# Patient Record
Sex: Female | Born: 1955 | Race: White | Hispanic: No | Marital: Married | Smoking: Never smoker
Health system: Southern US, Community
[De-identification: ages and names within clinical notes are randomized; demographics above are authoritative.]

## PROBLEM LIST (undated history)

## (undated) DIAGNOSIS — M1990 Unspecified osteoarthritis, unspecified site: Secondary | ICD-10-CM

## (undated) DIAGNOSIS — C801 Malignant (primary) neoplasm, unspecified: Secondary | ICD-10-CM

## (undated) HISTORY — PX: ABDOMINAL HYSTERECTOMY: SHX81

---

## 2004-04-20 ENCOUNTER — Ambulatory Visit: Payer: Self-pay | Admitting: Unknown Physician Specialty

## 2005-05-15 ENCOUNTER — Ambulatory Visit: Payer: Self-pay | Admitting: Unknown Physician Specialty

## 2005-06-21 ENCOUNTER — Ambulatory Visit: Payer: Self-pay | Admitting: Unknown Physician Specialty

## 2005-07-04 ENCOUNTER — Other Ambulatory Visit: Payer: Self-pay

## 2005-07-04 ENCOUNTER — Emergency Department: Payer: Self-pay | Admitting: Emergency Medicine

## 2006-05-30 ENCOUNTER — Ambulatory Visit: Payer: Self-pay | Admitting: Unknown Physician Specialty

## 2007-06-02 ENCOUNTER — Ambulatory Visit: Payer: Self-pay | Admitting: Unknown Physician Specialty

## 2007-12-04 DIAGNOSIS — Z86018 Personal history of other benign neoplasm: Secondary | ICD-10-CM

## 2007-12-04 HISTORY — DX: Personal history of other benign neoplasm: Z86.018

## 2013-01-21 ENCOUNTER — Ambulatory Visit: Payer: Self-pay

## 2014-01-01 DIAGNOSIS — C801 Malignant (primary) neoplasm, unspecified: Secondary | ICD-10-CM

## 2014-01-01 HISTORY — DX: Malignant (primary) neoplasm, unspecified: C80.1

## 2014-03-03 ENCOUNTER — Ambulatory Visit: Payer: Self-pay | Admitting: Family Medicine

## 2014-04-26 ENCOUNTER — Ambulatory Visit
Admit: 2014-04-26 | Disposition: A | Discharge status: Home / Self Care | Payer: Self-pay | Admitting: Unknown Physician Specialty

## 2014-04-29 LAB — SURGICAL PATHOLOGY

## 2014-09-20 DIAGNOSIS — C4492 Squamous cell carcinoma of skin, unspecified: Secondary | ICD-10-CM

## 2014-09-20 DIAGNOSIS — Z8589 Personal history of malignant neoplasm of other organs and systems: Secondary | ICD-10-CM

## 2014-09-20 HISTORY — DX: Personal history of malignant neoplasm of other organs and systems: Z85.89

## 2014-09-20 HISTORY — DX: Squamous cell carcinoma of skin, unspecified: C44.92

## 2014-10-25 DIAGNOSIS — Z86006 Personal history of melanoma in-situ: Secondary | ICD-10-CM

## 2014-10-25 HISTORY — DX: Personal history of melanoma in-situ: Z86.006

## 2017-01-31 ENCOUNTER — Other Ambulatory Visit: Payer: Self-pay | Admitting: Family Medicine

## 2017-01-31 DIAGNOSIS — Z1231 Encounter for screening mammogram for malignant neoplasm of breast: Secondary | ICD-10-CM

## 2017-02-20 ENCOUNTER — Ambulatory Visit
Admission: RE | Admit: 2017-02-20 | Discharge: 2017-02-20 | Disposition: A | Discharge status: Home / Self Care | Payer: BLUE CROSS/BLUE SHIELD | Source: Ambulatory Visit | Attending: Family Medicine | Admitting: Family Medicine

## 2017-02-20 ENCOUNTER — Other Ambulatory Visit: Payer: Self-pay | Admitting: Family Medicine

## 2017-02-20 DIAGNOSIS — Z1231 Encounter for screening mammogram for malignant neoplasm of breast: Secondary | ICD-10-CM

## 2017-02-20 HISTORY — DX: Malignant (primary) neoplasm, unspecified: C80.1

## 2017-02-25 ENCOUNTER — Other Ambulatory Visit: Payer: Self-pay | Admitting: Family Medicine

## 2017-02-25 DIAGNOSIS — R928 Other abnormal and inconclusive findings on diagnostic imaging of breast: Secondary | ICD-10-CM

## 2017-02-25 DIAGNOSIS — N6489 Other specified disorders of breast: Secondary | ICD-10-CM

## 2017-03-07 ENCOUNTER — Ambulatory Visit
Admission: RE | Admit: 2017-03-07 | Discharge: 2017-03-07 | Disposition: A | Discharge status: Home / Self Care | Payer: BLUE CROSS/BLUE SHIELD | Source: Ambulatory Visit | Attending: Family Medicine | Admitting: Family Medicine

## 2017-03-07 DIAGNOSIS — R928 Other abnormal and inconclusive findings on diagnostic imaging of breast: Secondary | ICD-10-CM

## 2017-03-07 DIAGNOSIS — N6489 Other specified disorders of breast: Secondary | ICD-10-CM | POA: Diagnosis present

## 2017-03-11 DIAGNOSIS — Z85820 Personal history of malignant melanoma of skin: Secondary | ICD-10-CM

## 2017-03-11 HISTORY — DX: Personal history of malignant melanoma of skin: Z85.820

## 2017-03-18 DIAGNOSIS — C439 Malignant melanoma of skin, unspecified: Secondary | ICD-10-CM

## 2017-03-18 HISTORY — DX: Malignant melanoma of skin, unspecified: C43.9

## 2018-01-29 ENCOUNTER — Other Ambulatory Visit: Payer: Self-pay | Admitting: Family Medicine

## 2018-01-29 DIAGNOSIS — Z1231 Encounter for screening mammogram for malignant neoplasm of breast: Secondary | ICD-10-CM

## 2018-02-28 ENCOUNTER — Ambulatory Visit
Admission: RE | Admit: 2018-02-28 | Discharge: 2018-02-28 | Disposition: A | Discharge status: Home / Self Care | Payer: PRIVATE HEALTH INSURANCE | Source: Ambulatory Visit | Attending: Family Medicine | Admitting: Family Medicine

## 2018-02-28 DIAGNOSIS — Z1231 Encounter for screening mammogram for malignant neoplasm of breast: Secondary | ICD-10-CM | POA: Insufficient documentation

## 2018-11-27 IMAGING — MG MM DIGITAL DIAGNOSTIC UNILAT*L* W/ TOMO W/ CAD
6 series · 6 of 14 positions shown · non-contrast
Comparison: 02/20/2017 and earlier priors

CLINICAL DATA: 61-year-old patient recalled from recent screening
mammogram for evaluation of a possible asymmetry in the outer left
breast.

EXAM:
DIGITAL DIAGNOSTIC UNILATERAL LEFT MAMMOGRAM WITH CAD AND TOMO

[L CC]
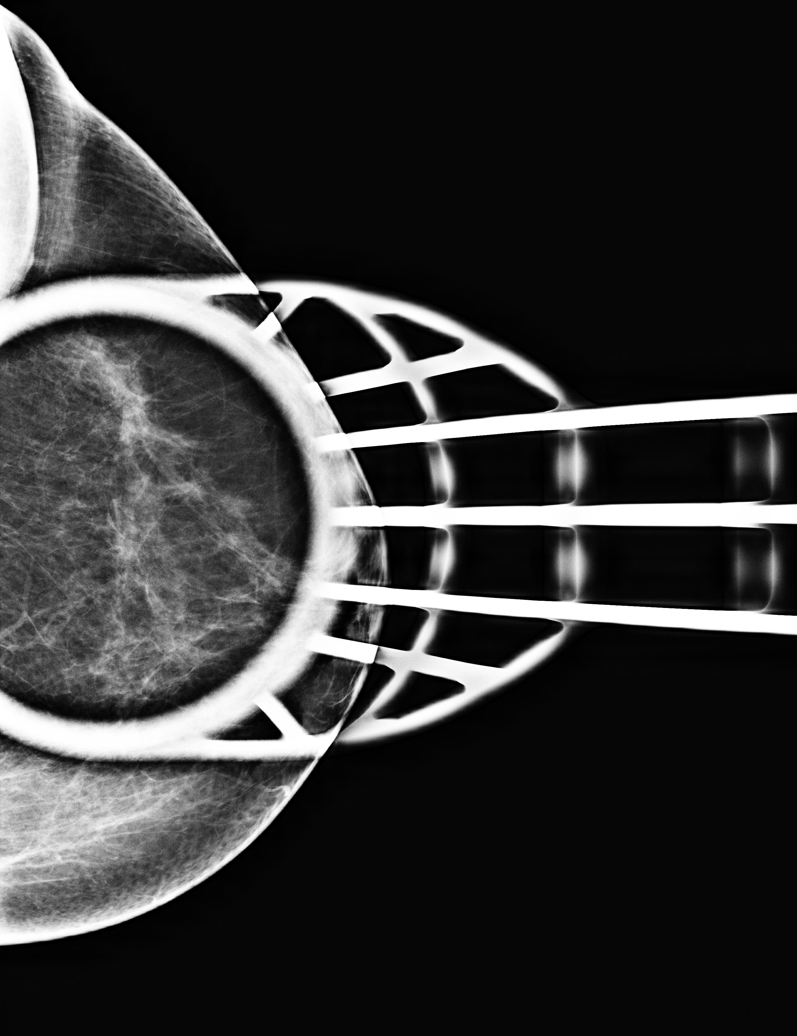

[L MLO]
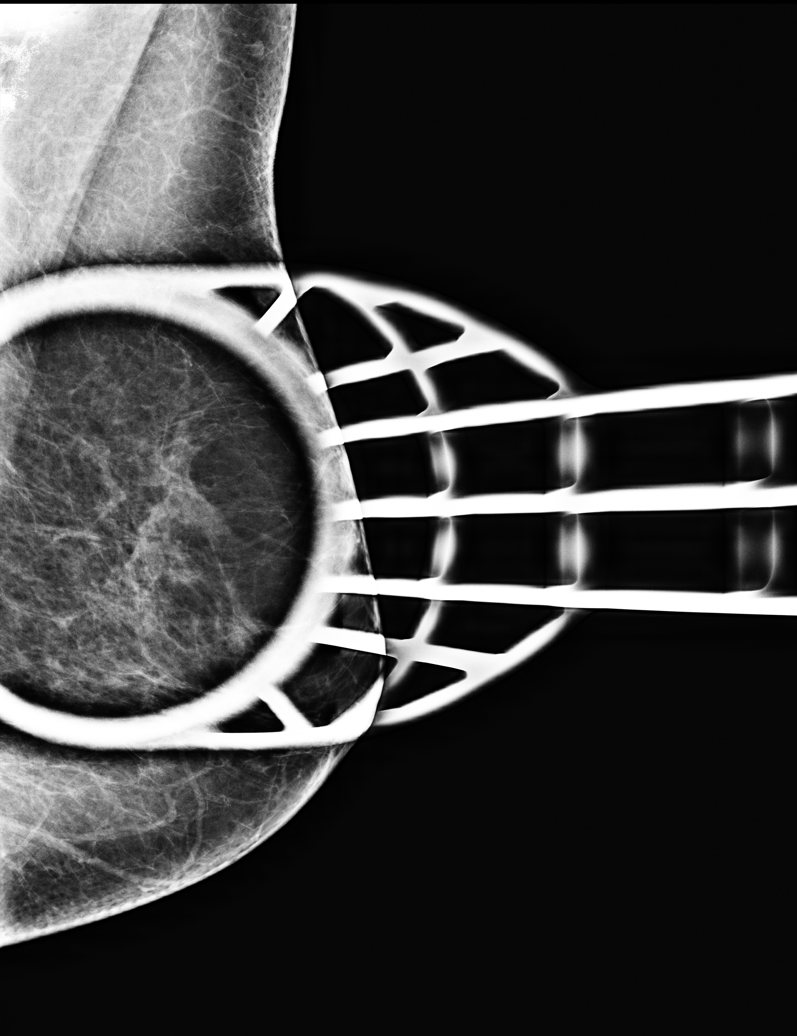

[L CC synth-2D]
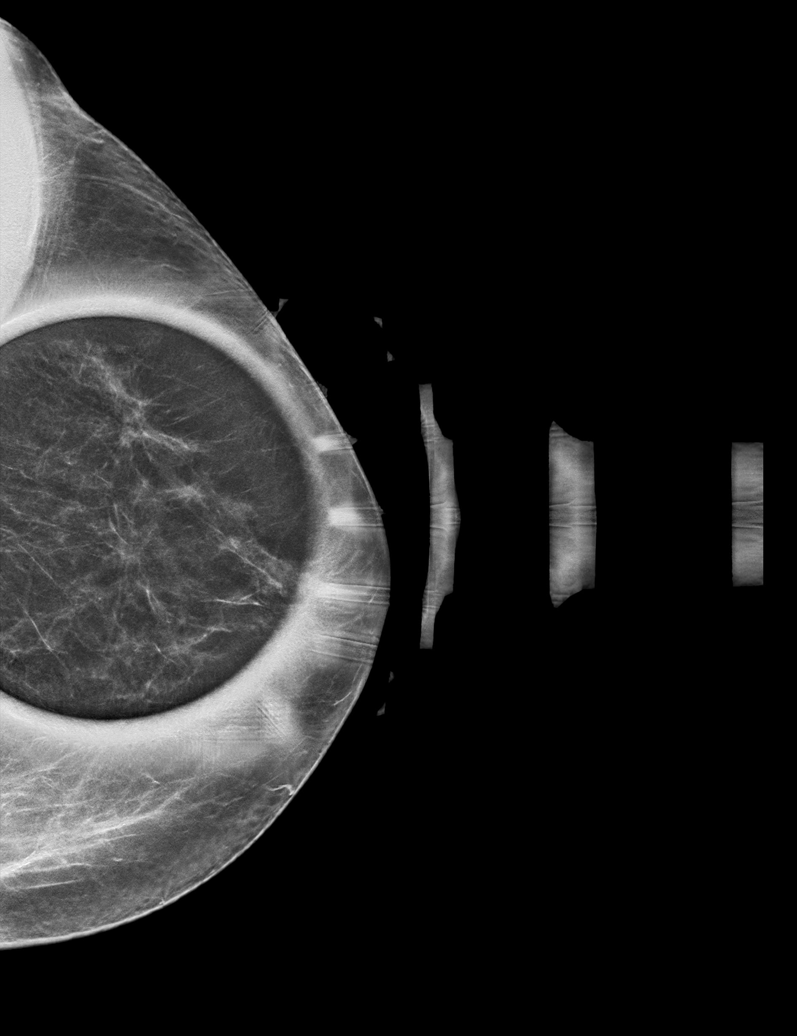

[L MLO synth-2D]
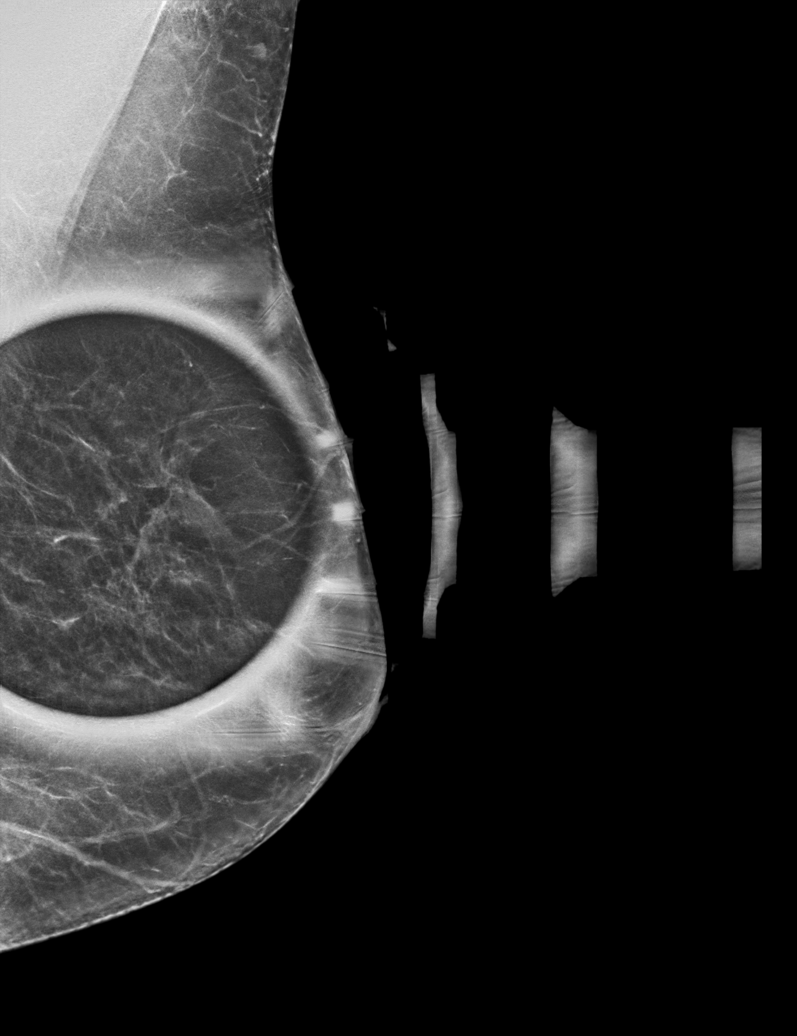

[L CC tomo · tomo slice 28/55.0]
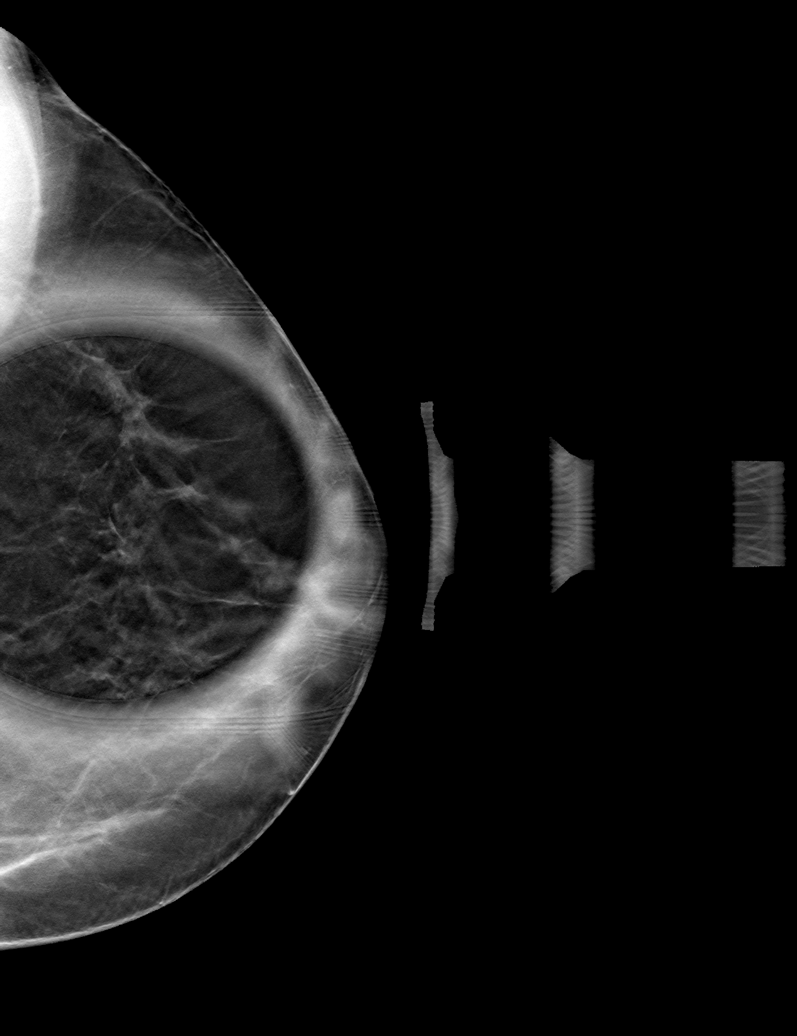

[L MLO tomo · tomo slice 27/54.0]
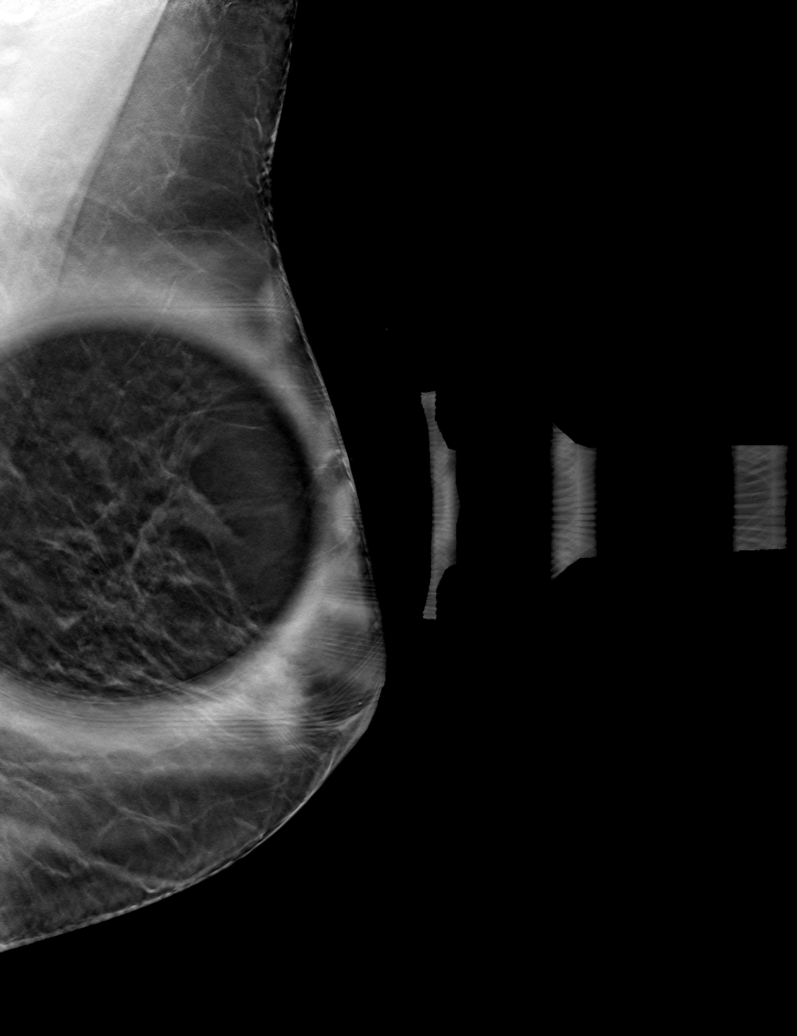

[6 of 14 positions shown; findings below may reference images not displayed]

ACR Breast Density Category c: The breast tissue is heterogeneously
dense, which may obscure small masses.
FINDINGS: Focal spot compression views of the upper outer left breast show
normal symmetric breast parenchyma. There is no mass or
architectural distortion. No suspicious findings.

Mammographic images were processed with CAD.
IMPRESSION: No evidence of malignancy in the left breast.

RECOMMENDATION:
Screening mammogram in one year.(Code:IR-X-HXH)

I have discussed the findings and recommendations with the patient.
Results were also provided in writing at the conclusion of the
visit. If applicable, a reminder letter will be sent to the patient
regarding the next appointment.

BI-RADS CATEGORY  1: Negative.

## 2019-01-26 ENCOUNTER — Other Ambulatory Visit: Payer: Self-pay | Admitting: Family Medicine

## 2019-01-26 DIAGNOSIS — Z1231 Encounter for screening mammogram for malignant neoplasm of breast: Secondary | ICD-10-CM

## 2019-03-02 ENCOUNTER — Ambulatory Visit
Admission: RE | Admit: 2019-03-02 | Discharge: 2019-03-02 | Disposition: A | Discharge status: Home / Self Care | Payer: 59 | Source: Ambulatory Visit | Attending: Family Medicine | Admitting: Family Medicine

## 2019-03-02 DIAGNOSIS — Z1231 Encounter for screening mammogram for malignant neoplasm of breast: Secondary | ICD-10-CM | POA: Diagnosis not present

## 2020-01-13 ENCOUNTER — Other Ambulatory Visit: Payer: Self-pay | Admitting: Family Medicine

## 2020-01-13 DIAGNOSIS — Z1231 Encounter for screening mammogram for malignant neoplasm of breast: Secondary | ICD-10-CM

## 2020-03-02 ENCOUNTER — Other Ambulatory Visit: Payer: Self-pay

## 2020-03-02 ENCOUNTER — Ambulatory Visit
Admission: RE | Admit: 2020-03-02 | Discharge: 2020-03-02 | Disposition: A | Discharge status: Home / Self Care | Payer: 59 | Source: Ambulatory Visit | Attending: Family Medicine | Admitting: Family Medicine

## 2020-03-02 DIAGNOSIS — Z1231 Encounter for screening mammogram for malignant neoplasm of breast: Secondary | ICD-10-CM | POA: Diagnosis present

## 2020-05-03 ENCOUNTER — Ambulatory Visit (INDEPENDENT_AMBULATORY_CARE_PROVIDER_SITE_OTHER): Payer: 59 | Admitting: Dermatology

## 2020-05-03 ENCOUNTER — Encounter: Payer: Self-pay | Admitting: Dermatology

## 2020-05-03 ENCOUNTER — Other Ambulatory Visit: Payer: Self-pay

## 2020-05-03 DIAGNOSIS — Z86018 Personal history of other benign neoplasm: Secondary | ICD-10-CM

## 2020-05-03 DIAGNOSIS — D1800 Hemangioma unspecified site: Secondary | ICD-10-CM

## 2020-05-03 DIAGNOSIS — Z1283 Encounter for screening for malignant neoplasm of skin: Secondary | ICD-10-CM | POA: Diagnosis not present

## 2020-05-03 DIAGNOSIS — D239 Other benign neoplasm of skin, unspecified: Secondary | ICD-10-CM

## 2020-05-03 DIAGNOSIS — Z86006 Personal history of melanoma in-situ: Secondary | ICD-10-CM

## 2020-05-03 DIAGNOSIS — D225 Melanocytic nevi of trunk: Secondary | ICD-10-CM

## 2020-05-03 DIAGNOSIS — Z85820 Personal history of malignant melanoma of skin: Secondary | ICD-10-CM | POA: Diagnosis not present

## 2020-05-03 DIAGNOSIS — D485 Neoplasm of uncertain behavior of skin: Secondary | ICD-10-CM

## 2020-05-03 DIAGNOSIS — L578 Other skin changes due to chronic exposure to nonionizing radiation: Secondary | ICD-10-CM

## 2020-05-03 DIAGNOSIS — L814 Other melanin hyperpigmentation: Secondary | ICD-10-CM

## 2020-05-03 DIAGNOSIS — C44519 Basal cell carcinoma of skin of other part of trunk: Secondary | ICD-10-CM | POA: Diagnosis not present

## 2020-05-03 DIAGNOSIS — Z85828 Personal history of other malignant neoplasm of skin: Secondary | ICD-10-CM

## 2020-05-03 DIAGNOSIS — L821 Other seborrheic keratosis: Secondary | ICD-10-CM

## 2020-05-03 DIAGNOSIS — D229 Melanocytic nevi, unspecified: Secondary | ICD-10-CM

## 2020-05-03 DIAGNOSIS — C4491 Basal cell carcinoma of skin, unspecified: Secondary | ICD-10-CM

## 2020-05-03 HISTORY — DX: Other benign neoplasm of skin, unspecified: D23.9

## 2020-05-03 HISTORY — DX: Basal cell carcinoma of skin, unspecified: C44.91

## 2020-05-03 NOTE — Patient Instructions (Addendum)
Wound Care Instructions  1. Cleanse wound gently with soap and water once a day then pat dry with clean gauze. Apply a thing coat of Petrolatum (petroleum jelly, "Vaseline") over the wound (unless you have an allergy to this). We recommend that you use a new, sterile tube of Vaseline. Do not pick or remove scabs. Do not remove the yellow or white "healing tissue" from the base of the wound.  2. Cover the wound with fresh, clean, nonstick gauze and secure with paper tape. You may use Band-Aids in place of gauze and tape if the would is small enough, but would recommend trimming much of the tape off as there is often too much. Sometimes Band-Aids can irritate the skin.  3. You should call the office for your biopsy report after 1 week if you have not already been contacted.  4. If you experience any problems, such as abnormal amounts of bleeding, swelling, significant bruising, significant pain, or evidence of infection, please call the office immediately.  5. FOR ADULT SURGERY PATIENTS: If you need something for pain relief you may take 1 extra strength Tylenol (acetaminophen) AND 2 Ibuprofen (200mg  each) together every 4 hours as needed for pain. (do not take these if you are allergic to them or if you have a reason you should not take them.) Typically, you may only need pain medication for 1 to 3 days.    Melanoma ABCDEs  Melanoma is the most dangerous type of skin cancer, and is the leading cause of death from skin disease.  You are more likely to develop melanoma if you:  Have light-colored skin, light-colored eyes, or red or blond hair  Spend a lot of time in the sun  Tan regularly, either outdoors or in a tanning bed  Have had blistering sunburns, especially during childhood  Have a close family member who has had a melanoma  Have atypical moles or large birthmarks  Early detection of melanoma is key since treatment is typically straightforward and cure rates are extremely high if  we catch it early.   The first sign of melanoma is often a change in a mole or a new dark spot.  The ABCDE system is a way of remembering the signs of melanoma.  A for asymmetry:  The two halves do not match. B for border:  The edges of the growth are irregular. C for color:  A mixture of colors are present instead of an even brown color. D for diameter:  Melanomas are usually (but not always) greater than 40mm - the size of a pencil eraser. E for evolution:  The spot keeps changing in size, shape, and color.  Please check your skin once per month between visits. You can use a small mirror in front and a large mirror behind you to keep an eye on the back side or your body.   If you see any new or changing lesions before your next follow-up, please call to schedule a visit.  Please continue daily skin protection including broad spectrum sunscreen SPF 30+ to sun-exposed areas, reapplying every 2 hours as needed when you're outdoors.   Staying in the shade or wearing long sleeves, sun glasses (UVA+UVB protection) and wide brim hats (4-inch brim around the entire circumference of the hat) are also recommended for sun protection.    If you have any questions or concerns for your doctor, please call our main line at [phone] and press option 4 to reach your doctor's medical assistant. If  medical assistant. If no one answers, please leave a voicemail as directed and we will return your call as soon as possible. Messages left after 4 pm will be answered the following business day.   You may also send us a message via MyChart. We typically respond to MyChart messages within 1-2 business days.  For prescription refills, please ask your pharmacy to contact our office. Our fax number is [phone].  If you have an urgent issue when the clinic is closed that cannot wait until the next business day, you can page your doctor at the number below.    Please note that while we do our best to be available for urgent  issues outside of office hours, we are not available 24/7.   If you have an urgent issue and are unable to reach us, you may choose to seek medical care at your doctor's office, retail clinic, urgent care center, or emergency room.  If you have a medical emergency, please immediately call 911 or go to the emergency department.  Pager Numbers  - Dr. Kowalski: [phone]  - Dr. Moye: [phone]  - Dr. Stewart: [phone]  In the event of inclement weather, please call our main line at [phone] for an update on the status of any delays or closures.  Dermatology Medication Tips: Please keep the boxes that topical medications come in in order to help keep track of the instructions about where and how to use these. Pharmacies typically print the medication instructions only on the boxes and not directly on the medication tubes.   If your medication is too expensive, please contact our office at [phone] option 4 or send us a message through MyChart.   We are unable to tell what your co-pay for medications will be in advance as this is different depending on your insurance coverage. However, we may be able to find a substitute medication at lower cost or fill out paperwork to get insurance to cover a needed medication.   If a prior authorization is required to get your medication covered by your insurance company, please allow us 1-2 business days to complete this process.  Drug prices often vary depending on where the prescription is filled and some pharmacies may offer cheaper prices.  The website www.goodrx.com contains coupons for medications through different pharmacies. The prices here do not account for what the cost may be with help from insurance (it may be cheaper with your insurance), but the website can give you the price if you did not use any insurance.  - You can print the associated coupon and take it with your prescription to the pharmacy.  - You may also stop by  our office during regular business hours and pick up a GoodRx coupon card.  - If you need your prescription sent electronically to a different pharmacy, notify our office through [place] MyChart or by phone at [phone] option 4.  

## 2020-05-03 NOTE — Progress Notes (Signed)
Follow-Up Visit   Subjective  Anita Stone is a 65 y.o. female who presents for the following: Annual Exam (Patient presents for TBSE. She has a few rough spots on the left popliteal, back, and chest, not bothersome. 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   The following portions of the chart were reviewed this encounter and updated as appropriate:       Review of Systems:  No other skin or systemic complaints except as noted in HPI or Assessment and Plan.  Objective  Well appearing patient in no apparent distress; mood and affect are within normal limits.  A full examination was performed including scalp, head, eyes, ears, nose, lips, neck, chest, axillae, abdomen, back, buttocks, bilateral upper extremities, bilateral lower extremities, hands, feet, fingers, toes, fingernails, and toenails. All findings within normal limits unless otherwise noted below.  Objective  Left Popliteal: Well healed scar with no evidence of recurrence.   Objective  Right Elbow: Well healed scar with no evidence of recurrence.   Objective  Right superior umbilicus: 7.0 x 3.0 mm medium brown macule with notch  Images    Objective  Left Lower Back: 5.0 x 3.71mm brown macule within scar     Objective  Left Abdomen: 7.0 x 4.51mm medium dark brown macule     Objective  Right Upper Sternum: 8.43mm pearly flesh white flat papule      Assessment & Plan   Skin cancer screening performed today.   Actinic Damage - chronic, secondary to cumulative UV radiation exposure/sun exposure over time - diffuse scaly erythematous macules with underlying dyspigmentation - Recommend daily broad spectrum sunscreen SPF 30+ to sun-exposed areas, reapply every 2 hours as needed.  - Recommend staying in the shade or wearing long sleeves, sun glasses (UVA+UVB protection) and wide brim hats (4-inch brim  around the entire circumference of the hat). - Call for new or changing lesions.  Melanocytic Nevi - Tan-brown and/or pink-flesh-colored symmetric macules and papules - Benign appearing on exam today - Observation - Call clinic for new or changing moles - Recommend daily use of broad spectrum spf 30+ sunscreen to sun-exposed areas.   Lentigines - Scattered tan macules - Due to sun exposure - Benign-appering, observe - Recommend daily broad spectrum sunscreen SPF 30+ to sun-exposed areas, reapply every 2 hours as needed. - Call for any changes  History of Squamous Cell Carcinoma of the Skin - No evidence of recurrence today of the left clavicle - No lymphadenopathy - Recommend regular full body skin exams - Recommend daily broad spectrum sunscreen SPF 30+ to sun-exposed areas, reapply every 2 hours as needed.  - Call if any new or changing lesions are noted between office visits  History of Dysplastic Nevi - No evidence of recurrence today - Recommend regular full body skin exams - Recommend daily broad spectrum sunscreen SPF 30+ to sun-exposed areas, reapply every 2 hours as needed.  - Call if any new or changing lesions are noted between office visits  Hemangiomas - Red papules - Discussed benign nature - Observe - Call for any changes  Seborrheic Keratoses - Stuck-on, waxy, tan-brown papules and/or plaques  - Benign-appearing - Discussed benign etiology and prognosis. - Observe - Call for any changes    History of melanoma Left Popliteal  Clarks level 3, Excised 03/18/2017  Clear. Observe for recurrence. Call clinic for new or changing lesions.  Recommend regular skin exams, daily broad-spectrum spf 30+ sunscreen use, and photoprotection.     History of melanoma in situ Right Elbow  Excised 10/2014  Clear. Observe for recurrence. Call clinic for new or changing lesions.  Recommend regular skin exams, daily broad-spectrum spf 30+ sunscreen use, and  photoprotection.     Nevus Right superior umbilicus  Benign-appearing.  Stable. Observation.  Call clinic for new or changing moles.  Recommend daily use of broad spectrum spf 30+ sunscreen to sun-exposed areas.    Neoplasm of uncertain behavior of skin (3) Left Lower Back  Epidermal / dermal shaving  Lesion diameter (cm):  0.6 Informed consent: discussed and consent obtained   Patient was prepped and draped in usual sterile fashion: Area prepped with alcohol. Anesthesia: the lesion was anesthetized in a standard fashion   Anesthetic:  0.5% bupivicaine w/ epinephrine 1-100,000 local infiltration Instrument used: flexible razor blade   Hemostasis achieved with: pressure, aluminum chloride and electrodesiccation   Outcome: patient tolerated procedure well   Post-procedure details: wound care instructions given   Post-procedure details comment:  Ointment and small bandage applied  Specimen 1 - Surgical pathology Differential Diagnosis: Recurrent Dysplastic Nevus Check Margins: Yes 5.0 x 3.107mm brown macule within scar [id number]  Left Abdomen  Epidermal / dermal shaving  Lesion diameter (cm):  0.9 Informed consent: discussed and consent obtained   Patient was prepped and draped in usual sterile fashion: Area prepped with alcohol. Anesthesia: the lesion was anesthetized in a standard fashion   Anesthetic:  0.5% bupivicaine w/ epinephrine 1-100,000 local infiltration Instrument used: flexible razor blade   Hemostasis achieved with: pressure, aluminum chloride and electrodesiccation   Outcome: patient tolerated procedure well   Post-procedure details: wound care instructions given   Post-procedure details comment:  Ointment and small bandage applied  Specimen 2 - Surgical pathology Differential Diagnosis: Nevus r/o Dysplasia Check Margins: Yes 7.0 x 4.33mm medium dark brown macule Hx of melanoma  Right Upper Sternum  Skin / nail biopsy Type of biopsy: tangential    Informed consent: discussed and consent obtained   Patient was prepped and draped in usual sterile fashion: Area prepped with alcohol. Anesthesia: the lesion was anesthetized in a standard fashion   Anesthetic:  0.5% bupivicaine w/ epinephrine 1-100,000 local infiltration Instrument used: flexible razor blade   Hemostasis achieved with: pressure, aluminum chloride and electrodesiccation   Outcome: patient tolerated procedure well   Post-procedure details: wound care instructions given   Post-procedure details comment:  Ointment and small bandage applied  Specimen 3 - Surgical pathology Differential Diagnosis: ISK r/o BCC  Check Margins: No 8.25mm pearly flesh white flat papule  If R upper sternum + BCC, discussed excision vrs EDC. Pt prefers EDC.  Return in about 6 months (around 11/03/2020) for TBSE, Hx melanoma.   IJamesetta Orleans, CMA, am acting as scribe for Brendolyn Patty, MD .  Documentation: I have reviewed the above documentation for accuracy and completeness, and I agree with the above.  Brendolyn Patty MD

## 2020-05-09 ENCOUNTER — Telehealth: Payer: Self-pay

## 2020-05-09 NOTE — Telephone Encounter (Signed)
Advised pt of bx results.  Scheduled pt for [facility]./sh

## 2020-05-09 NOTE — Telephone Encounter (Signed)
-----   Message from Brendolyn Patty, MD sent at 05/09/2020 11:33 AM EDT ----- 1. Skin , left lower back RECURRENT MELANOCYTIC NEVUS, LIMITED MARGINS FREE 2. Skin , left abdomen DYSPLASTIC COMPOUND NEVUS WITH MODERATE ATYPIA, CLOSE TO MARGIN 3. Skin , right upper sternum BASAL CELL CARCINOMA, MICRONODULAR PATTERN, BASE INVOLVED  1. Recurrent nevus 2. Moderately atypical mole- observation 3. BCC skin cancer- discussed excision vrs EDC during OV, pt prefers EDC, needs appt

## 2020-05-24 ENCOUNTER — Other Ambulatory Visit: Payer: Self-pay

## 2020-05-24 ENCOUNTER — Ambulatory Visit (INDEPENDENT_AMBULATORY_CARE_PROVIDER_SITE_OTHER): Payer: 59 | Admitting: Dermatology

## 2020-05-24 DIAGNOSIS — Z85828 Personal history of other malignant neoplasm of skin: Secondary | ICD-10-CM | POA: Diagnosis not present

## 2020-05-24 DIAGNOSIS — Z86018 Personal history of other benign neoplasm: Secondary | ICD-10-CM | POA: Diagnosis not present

## 2020-05-24 DIAGNOSIS — L578 Other skin changes due to chronic exposure to nonionizing radiation: Secondary | ICD-10-CM | POA: Diagnosis not present

## 2020-05-24 DIAGNOSIS — C44519 Basal cell carcinoma of skin of other part of trunk: Secondary | ICD-10-CM | POA: Diagnosis not present

## 2020-05-24 NOTE — Progress Notes (Signed)
   Follow-Up Visit   Subjective  Anita Stone is a 65 y.o. female who presents for the following: Procedure (Patient here today to treat bx proven BCC at right upper sternum. ).    The following portions of the chart were reviewed this encounter and updated as appropriate:       Review of Systems:  No other skin or systemic complaints except as noted in HPI or Assessment and Plan.  Objective  Well appearing patient in no apparent distress; mood and affect are within normal limits.  A focused examination was performed including chest. Relevant physical exam findings are noted in the Assessment and Plan.  Objective  Right upper sternum: Healing bx site   Assessment & Plan  Basal cell carcinoma (BCC) of skin of other part of torso Right upper sternum  Destruction of lesion  Destruction method: electrodesiccation and curettage   Informed consent: discussed and consent obtained   Timeout:  patient name, date of birth, surgical site, and procedure verified Anesthesia: the lesion was anesthetized in a standard fashion   Anesthetic:  0.5% bupivicaine w/ epinephrine 1-100,000 local infiltration Curettage performed in three different directions: Yes   Electrodesiccation performed over the curetted area: Yes   Final wound size (cm):  1.1 Hemostasis achieved with:  pressure, aluminum chloride and electrodesiccation Outcome: patient tolerated procedure well with no complications   Post-procedure details: wound care instructions given   Additional details:  Mupirocin ointment and Bandaid applied    History of Dysplastic Nevi - No evidence of recurrence today at left abdomen, left lower back - Recommend regular full body skin exams - Recommend daily broad spectrum sunscreen SPF 30+ to sun-exposed areas, reapply every 2 hours as needed.  - Call if any new or changing lesions are noted between office visits  History of Melanoma - No evidence of recurrence today R elbow, L  popliteal - Recommend regular full body skin exams - Recommend daily broad spectrum sunscreen SPF 30+ to sun-exposed areas, reapply every 2 hours as needed.  - Call if any new or changing lesions are noted between office visits  Actinic Damage - chronic, secondary to cumulative UV radiation exposure/sun exposure over time - diffuse scaly erythematous macules with underlying dyspigmentation - Recommend daily broad spectrum sunscreen SPF 30+ to sun-exposed areas, reapply every 2 hours as needed.  - Recommend staying in the shade or wearing long sleeves, sun glasses (UVA+UVB protection) and wide brim hats (4-inch brim around the entire circumference of the hat). - Call for new or changing lesions.   Return for TBSE, as scheduled.  Graciella Belton, RMA, am acting as scribe for Brendolyn Patty, MD .  Documentation: I have reviewed the above documentation for accuracy and completeness, and I agree with the above.  Brendolyn Patty MD

## 2020-05-24 NOTE — Patient Instructions (Addendum)

## 2020-11-15 ENCOUNTER — Ambulatory Visit (INDEPENDENT_AMBULATORY_CARE_PROVIDER_SITE_OTHER): Payer: 59 | Admitting: Dermatology

## 2021-01-24 ENCOUNTER — Other Ambulatory Visit: Payer: Self-pay | Admitting: Family Medicine

## 2021-01-24 DIAGNOSIS — Z1231 Encounter for screening mammogram for malignant neoplasm of breast: Secondary | ICD-10-CM

## 2021-03-13 ENCOUNTER — Other Ambulatory Visit: Payer: Self-pay

## 2021-03-13 ENCOUNTER — Ambulatory Visit
Admission: RE | Admit: 2021-03-13 | Discharge: 2021-03-13 | Disposition: A | Discharge status: Home / Self Care | Payer: 59 | Source: Ambulatory Visit | Attending: Family Medicine | Admitting: Family Medicine

## 2021-03-13 DIAGNOSIS — Z1231 Encounter for screening mammogram for malignant neoplasm of breast: Secondary | ICD-10-CM | POA: Diagnosis not present

## 2021-03-24 ENCOUNTER — Other Ambulatory Visit: Payer: Self-pay

## 2021-03-24 ENCOUNTER — Ambulatory Visit
Admission: RE | Admit: 2021-03-24 | Discharge: 2021-03-24 | Disposition: A | Discharge status: Home / Self Care | Payer: 59 | Source: Ambulatory Visit

## 2021-03-24 VITALS — BP 137/85 | HR 82 | Temp 98.2°F | Resp 16

## 2021-03-24 DIAGNOSIS — L237 Allergic contact dermatitis due to plants, except food: Secondary | ICD-10-CM | POA: Diagnosis not present

## 2021-03-24 MED ORDER — PREDNISONE 10 MG (21) PO TBPK: ORAL_TABLET | Freq: Every day | ORAL | 0 refills | Status: DC

## 2021-03-24 NOTE — ED Provider Notes (Signed)
?[facility] ? ? ? ?CSN: [id number] ?Arrival date & time: 03/24/21  1302 ? ? ?  ? ?History   ?Chief Complaint ?Chief Complaint  ?Patient presents with  ? Allergic Reaction  ?  Entered by patient  ? Rash  ? ? ?HPI ?FEVEN ALDERFER is a 66 y.o. female.  Patient presents with pruritic rash on her trunk and extremities x2 weeks after coming in contact with poison ivy in her yard.  The rash is red and pruritic and spreading.  She took some of her father-in-law's prednisone.  She also has taken Benadryl at bedtime.  She denies fever, chills, sore throat, cough, shortness of breath, or other symptoms.  Her medical history includes melanoma, basal cell carcinoma, squamous cell carcinoma. ? ?The history is provided by the patient and medical records.  ? ?Past Medical History:  ?Diagnosis Date  ? Basal cell carcinoma 05/03/2020  ? R upper sternum  ? Cancer [facility]) 2016  ? melanoma  ? Dysplastic nevus 05/03/2020  ? L abdomen, moderate  ? History of dysplastic nevus 02/17/2018  ? left lower back, moderate  ? History of dysplastic nevus 03/11/2017  ? spinal upper back, severe / excision 04/08/2017  ? History of dysplastic nevus 12/04/2007  ? left upper back, mod to severe / excised 01/22/2008  ? History of melanoma 03/11/2017  ? left popliteal, Clark's level 3, Breslow's Depth 0.31m / excised 03/18/2017  ? History of melanoma in situ 10/25/2014  ? right elbow / excision 11/01/2014  ? History of squamous cell carcinoma 09/20/2014  ? left clavicle  ? [place]  ? Left popliteal, Clarks level 3, Breslow's 0.533m ? Squamous cell carcinoma of skin 09/20/2014  ? left clavicle  ? ? ?There are no problems to display for this patient. ? ? ?Past Surgical History:  ?Procedure Laterality Date  ? ABDOMINAL HYSTERECTOMY    ? ? ?OB History   ?No obstetric history on file. ?  ? ? ? ?Home Medications   ? ?Prior to Admission medications   ?Medication Sig Start Date End Date Taking? Authorizing Provider  ?predniSONE  (STERAPRED UNI-PAK 21 TAB) 10 MG (21) TBPK tablet Take by mouth daily. As directed 03/24/21  Yes TaSharion BalloonNP  ?acetaminophen (TYLENOL) 325 MG tablet Take by mouth.    [provider]  ? ? ?Family History ?Family History  ?Problem Relation Age of Onset  ? Breast cancer Mother 5554? ? ?Social History ?Social History  ? ?Tobacco Use  ? Smoking status: Never  ? Smokeless tobacco: Never  ?Vaping Use  ? Vaping Use: Never used  ?Substance Use Topics  ? Alcohol use: Never  ? Drug use: Never  ? ? ? ?Allergies   ?Patient has no known allergies. ? ? ?Review of Systems ?Review of Systems  ?Constitutional:  Negative for chills and fever.  ?HENT:  Negative for ear pain and sore throat.   ?Respiratory:  Negative for cough and shortness of breath.   ?Cardiovascular:  Negative for chest pain and palpitations.  ?Skin:  Positive for rash.  ?All other systems reviewed and are negative. ? ? ?Physical Exam ?Triage Vital Signs ?ED Triage Vitals  ?Enc Vitals Group  ?   BP   ?   Pulse   ?   Resp   ?   Temp   ?   Temp src   ?   SpO2   ?   Weight   ?   Height   ?  Head Circumference   ?   Peak Flow   ?   Pain Score   ?   Pain Loc   ?   Pain Edu?   ?   Excl. in [place]?   ? ?No data found. ? ?Updated Vital Signs ?BP 137/85 (BP Location: Right Arm)   Pulse 82   Temp 98.2 ?F (36.8 ?C) (Oral)   Resp 16   SpO2 96%  ? ?Visual Acuity ?Right Eye Distance:   ?Left Eye Distance:   ?Bilateral Distance:   ? ?Right Eye Near:   ?Left Eye Near:    ?Bilateral Near:    ? ?Physical Exam ?Vitals and nursing note reviewed.  ?Constitutional:   ?   General: She is not in acute distress. ?   Appearance: Normal appearance. She is well-developed. She is not ill-appearing.  ?HENT:  ?   Mouth/Throat:  ?   Mouth: Mucous membranes are moist.  ?   Pharynx: Oropharynx is clear.  ?Cardiovascular:  ?   Rate and Rhythm: Normal rate and regular rhythm.  ?   Heart sounds: Normal heart sounds.  ?Pulmonary:  ?   Effort: Pulmonary effort is normal. No respiratory  distress.  ?   Breath sounds: Normal breath sounds.  ?Musculoskeletal:  ?   Cervical back: Neck supple.  ?Skin: ?   General: Skin is warm and dry.  ?   Findings: Lesion present.  ?   Comments: Scattered red papular rash on chest, abdomen, arms, legs.  ?Neurological:  ?   Mental Status: She is alert.  ?Psychiatric:     ?   Mood and Affect: Mood normal.     ?   Behavior: Behavior normal.  ? ? ? ?UC Treatments / Results  ?Labs ?(all labs ordered are listed, but only abnormal results are displayed) ?Labs Reviewed - No data to display ? ?EKG ? ? ?Radiology ?No results found. ? ?Procedures ?Procedures (including critical care time) ? ?Medications Ordered in UC ?Medications - No data to display ? ?Initial Impression / Assessment and Plan / UC Course  ?I have reviewed the triage vital signs and the nursing notes. ? ?Pertinent labs & imaging results that were available during my care of the patient were reviewed by me and considered in my medical decision making (see chart for details). ? ?  ?Allergic dermatitis due to poison ivy.  Instructed patient to continue Benadryl at bedtime and to add Claritin or Allegra during the day.  Also treating with prednisone taper.  Instructed patient to follow-up with her PCP or a dermatologist if the rash is not improving.  She agrees to plan of care. ? ?Final Clinical Impressions(s) / UC Diagnoses  ? ?Final diagnoses:  ?Allergic dermatitis due to poison ivy  ? ? ? ?Discharge Instructions   ? ?  ?Take Allegra in the morning and Benadryl at night.   ? ?Take the prednisone as directed.   ? ?Follow up with your primary care provider or a dermatologist if your symptoms are not improving.   ? ? ? ? ? ?ED Prescriptions   ? ? Medication Sig Dispense Auth. Provider  ? predniSONE (STERAPRED UNI-PAK 21 TAB) 10 MG (21) TBPK tablet Take by mouth daily. As directed 21 tablet Sharion Balloon, NP  ? ?  ? ?PDMP not reviewed this encounter. ?  ?Sharion Balloon, NP ?03/24/21 1353 ? ?

## 2021-03-24 NOTE — Discharge Instructions (Addendum)
Take Allegra in the morning and Benadryl at night.   ? ?Take the prednisone as directed.   ? ?Follow up with your primary care provider or a dermatologist if your symptoms are not improving.   ? ?

## 2021-03-24 NOTE — ED Triage Notes (Signed)
Pt presents with a rash ?poison oak x 1 week.  ?

## 2021-07-24 DIAGNOSIS — M1712 Unilateral primary osteoarthritis, left knee: Secondary | ICD-10-CM | POA: Diagnosis not present

## 2021-08-08 DIAGNOSIS — Z0000 Encounter for general adult medical examination without abnormal findings: Secondary | ICD-10-CM | POA: Diagnosis not present

## 2021-08-15 DIAGNOSIS — Z0000 Encounter for general adult medical examination without abnormal findings: Secondary | ICD-10-CM | POA: Diagnosis not present

## 2021-08-15 DIAGNOSIS — M25562 Pain in left knee: Secondary | ICD-10-CM | POA: Diagnosis not present

## 2021-08-15 DIAGNOSIS — K219 Gastro-esophageal reflux disease without esophagitis: Secondary | ICD-10-CM | POA: Diagnosis not present

## 2021-08-15 DIAGNOSIS — M7062 Trochanteric bursitis, left hip: Secondary | ICD-10-CM | POA: Diagnosis not present

## 2021-08-15 DIAGNOSIS — G8929 Other chronic pain: Secondary | ICD-10-CM | POA: Diagnosis not present

## 2021-08-15 DIAGNOSIS — M545 Low back pain, unspecified: Secondary | ICD-10-CM | POA: Diagnosis not present

## 2021-09-18 DIAGNOSIS — D2261 Melanocytic nevi of right upper limb, including shoulder: Secondary | ICD-10-CM | POA: Diagnosis not present

## 2021-09-18 DIAGNOSIS — D225 Melanocytic nevi of trunk: Secondary | ICD-10-CM | POA: Diagnosis not present

## 2021-09-18 DIAGNOSIS — D2272 Melanocytic nevi of left lower limb, including hip: Secondary | ICD-10-CM | POA: Diagnosis not present

## 2021-09-18 DIAGNOSIS — L570 Actinic keratosis: Secondary | ICD-10-CM | POA: Diagnosis not present

## 2021-09-18 DIAGNOSIS — Z85820 Personal history of malignant melanoma of skin: Secondary | ICD-10-CM | POA: Diagnosis not present

## 2021-09-18 DIAGNOSIS — D0439 Carcinoma in situ of skin of other parts of face: Secondary | ICD-10-CM | POA: Diagnosis not present

## 2021-09-18 DIAGNOSIS — D485 Neoplasm of uncertain behavior of skin: Secondary | ICD-10-CM | POA: Diagnosis not present

## 2021-10-12 DIAGNOSIS — D0439 Carcinoma in situ of skin of other parts of face: Secondary | ICD-10-CM | POA: Diagnosis not present

## 2021-10-17 DIAGNOSIS — M7062 Trochanteric bursitis, left hip: Secondary | ICD-10-CM | POA: Diagnosis not present

## 2021-12-12 DIAGNOSIS — Z85828 Personal history of other malignant neoplasm of skin: Secondary | ICD-10-CM | POA: Diagnosis not present

## 2021-12-14 DIAGNOSIS — M1712 Unilateral primary osteoarthritis, left knee: Secondary | ICD-10-CM | POA: Diagnosis not present

## 2021-12-17 DIAGNOSIS — G43909 Migraine, unspecified, not intractable, without status migrainosus: Secondary | ICD-10-CM | POA: Insufficient documentation

## 2021-12-17 DIAGNOSIS — M1712 Unilateral primary osteoarthritis, left knee: Secondary | ICD-10-CM | POA: Insufficient documentation

## 2021-12-17 DIAGNOSIS — M419 Scoliosis, unspecified: Secondary | ICD-10-CM | POA: Insufficient documentation

## 2022-01-31 ENCOUNTER — Other Ambulatory Visit: Payer: Self-pay

## 2022-01-31 DIAGNOSIS — Z1231 Encounter for screening mammogram for malignant neoplasm of breast: Secondary | ICD-10-CM

## 2022-01-31 NOTE — Discharge Instructions (Signed)
Instructions after Total Knee Replacement   Anita Stone, Jr., M.D.     Dept. of Orthopaedics & Sports Medicine  [facility]  [address]  [place], [place]  [address]  Phone: [phone]   Fax: [phone]    DIET: Drink plenty of non-alcoholic fluids. Resume your normal diet. Include foods high in fiber.  ACTIVITY:  You may use crutches or a walker with weight-bearing as tolerated, unless instructed otherwise. You may be weaned off of the walker or crutches by your Physical Therapist.  Do NOT place pillows under the knee. Anything placed under the knee could limit your ability to straighten the knee.   Continue doing gentle exercises. Exercising will reduce the pain and swelling, increase motion, and prevent muscle weakness.   Please continue to use the TED compression stockings for 6 weeks. You may remove the stockings at night, but should reapply them in the morning. Do not drive or operate any equipment until instructed.  WOUND CARE:  Continue to use the PolarCare or ice packs periodically to reduce pain and swelling. You may bathe or shower after the staples are removed at the first office visit following surgery.  MEDICATIONS: You may resume your regular medications. Please take the pain medication as prescribed on the medication. Do not take pain medication on an empty stomach. You have been given a prescription for a blood thinner (Lovenox or Coumadin). Please take the medication as instructed. (NOTE: After completing a 2 week course of Lovenox, take one Enteric-coated aspirin once a day. This along with elevation will help reduce the possibility of phlebitis in your operated leg.) Do not drive or drink alcoholic beverages when taking pain medications.  CALL THE OFFICE FOR: Temperature above 101 degrees Excessive bleeding or drainage on the dressing. Excessive swelling, coldness, or paleness of the toes. Persistent nausea and vomiting.  FOLLOW-UP:  You  should have an appointment to return to the office in 10-14 days after surgery. Arrangements have been made for continuation of Physical Therapy (either home therapy or outpatient therapy).   [facility] Department Directory         [email or web address]       [email or web address]          Cardiology  Appointments: [place] - [phone] [place] - [phone]  Endocrinology  Appointments: [place] - [phone] [place] - [phone]  Gastroenterology  Appointments: [place] - [phone] [place] - [phone]        General Surgery   Appointments: [place] - [phone]  Internal Medicine/Family Medicine  Appointments: [place] - [phone] [place] - [phone] [place] - [phone]  Metabolic and Weigh Loss Surgery  Appointments: [place] - [phone]        Neurology  Appointments: [place] - [phone] [place] - [phone]  Neurosurgery  Appointments: [place] - [phone]  Obstetrics & Gynecology  Appointments: [place] - [phone] [place] - [phone]        Pediatrics  Appointments: [place] - [phone] [place] - [phone]  Physiatry  Appointments: [place] -[phone]  Physical Therapy  Appointments: [place] - [phone] [place] - [phone]        Podiatry  Appointments: [place] - [phone] [place] - [phone]  Pulmonology  Appointments: [place] - [phone]  Rheumatology  Appointments: [place] - [phone]        [place] Location: [facility]  [address] [place], [place]  [address]  [place] Location: [facility] [address] [place], [place]  [address]  [place] Location: [facility] [address] [place], [place]  [address]    

## 2022-02-06 ENCOUNTER — Encounter
Admission: RE | Admit: 2022-02-06 | Discharge: 2022-02-06 | Disposition: A | Discharge status: Home / Self Care | Payer: PPO | Source: Ambulatory Visit | Attending: Orthopedic Surgery | Admitting: Orthopedic Surgery

## 2022-02-06 ENCOUNTER — Other Ambulatory Visit: Payer: Self-pay

## 2022-02-06 VITALS — BP 144/82 | HR 61 | Resp 16 | Ht 65.0 in | Wt 167.8 lb

## 2022-02-06 DIAGNOSIS — M1712 Unilateral primary osteoarthritis, left knee: Secondary | ICD-10-CM | POA: Insufficient documentation

## 2022-02-06 DIAGNOSIS — Z01818 Encounter for other preprocedural examination: Secondary | ICD-10-CM | POA: Diagnosis not present

## 2022-02-06 DIAGNOSIS — Z01812 Encounter for preprocedural laboratory examination: Secondary | ICD-10-CM

## 2022-02-06 HISTORY — DX: Unspecified osteoarthritis, unspecified site: M19.90

## 2022-02-06 LAB — URINALYSIS, ROUTINE W REFLEX MICROSCOPIC
Bilirubin Urine: NEGATIVE
Glucose, UA: NEGATIVE mg/dL
Hgb urine dipstick: NEGATIVE
Ketones, ur: NEGATIVE mg/dL
Nitrite: NEGATIVE
Protein, ur: NEGATIVE mg/dL
Specific Gravity, Urine: 1.003 — ABNORMAL LOW (ref 1.005–1.030)
pH: 7 (ref 5.0–8.0)

## 2022-02-06 LAB — CBC
HCT: 44.4 % (ref 36.0–46.0)
Hemoglobin: 14.6 g/dL (ref 12.0–15.0)
MCH: 28.2 pg (ref 26.0–34.0)
MCHC: 32.9 g/dL (ref 30.0–36.0)
MCV: 85.9 fL (ref 80.0–100.0)
Platelets: 283 10*3/uL (ref 150–400)
RBC: 5.17 MIL/uL — ABNORMAL HIGH (ref 3.87–5.11)
RDW: 13.5 % (ref 11.5–15.5)
WBC: 7 10*3/uL (ref 4.0–10.5)
nRBC: 0 % (ref 0.0–0.2)

## 2022-02-06 LAB — COMPREHENSIVE METABOLIC PANEL
ALT: 26 U/L (ref 0–44)
AST: 21 U/L (ref 15–41)
Albumin: 4.4 g/dL (ref 3.5–5.0)
Alkaline Phosphatase: 54 U/L (ref 38–126)
Anion gap: 8 (ref 5–15)
BUN: 10 mg/dL (ref 8–23)
CO2: 28 mmol/L (ref 22–32)
Calcium: 9.7 mg/dL (ref 8.9–10.3)
Chloride: 103 mmol/L (ref 98–111)
Creatinine, Ser: 0.64 mg/dL (ref 0.44–1.00)
GFR, Estimated: >60 mL/min (ref >60)
Glucose, Bld: 95 mg/dL (ref 70–99)
Potassium: 3.8 mmol/L (ref 3.5–5.1)
Sodium: 139 mmol/L (ref 135–145)
Total Bilirubin: 0.8 mg/dL (ref 0.3–1.2)
Total Protein: 7.3 g/dL (ref 6.5–8.1)

## 2022-02-06 LAB — TYPE AND SCREEN
ABO/RH(D): AB POS
Antibody Screen: NEGATIVE

## 2022-02-06 LAB — SURGICAL PCR SCREEN
MRSA, PCR: NEGATIVE
Staphylococcus aureus: NEGATIVE

## 2022-02-06 LAB — C-REACTIVE PROTEIN: CRP: 0.5 mg/dL (ref <1.0)

## 2022-02-06 LAB — SEDIMENTATION RATE: Sed Rate: 4 mm/hr (ref 0–30)

## 2022-02-06 NOTE — Patient Instructions (Addendum)
Your procedure is scheduled on: 02/16/22 - Friday Report to the Registration Desk on the 1st floor of the [facility]. To find out your arrival time, please call [phone] between 1PM - 3PM on: 02/15/22 - Thursday If your arrival time is 6:00 am, do not arrive before that time as the [facility] entrance doors do not open until 6:00 am.  REMEMBER: Instructions that are not followed completely may result in serious medical risk, up to and including death; or upon the discretion of your surgeon and anesthesiologist your surgery may need to be rescheduled.  Do not eat food after midnight the night before surgery.  No gum chewing or hard candies.  You may however, drink CLEAR liquids up to 2 hours before you are scheduled to arrive for your surgery. Do not drink anything within 2 hours of your scheduled arrival time.  Clear liquids include: - water  - apple juice without pulp - gatorade (not RED colors) - black coffee or tea (Do NOT add milk or creamers to the coffee or tea) Do NOT drink anything that is not on this list.  In addition, your doctor has ordered for you to drink the provided:  Ensure Pre-Surgery Clear Carbohydrate Drink  Drinking this carbohydrate drink up to two hours before surgery helps to reduce insulin resistance and improve patient outcomes. Please complete drinking 2 hours before scheduled arrival time.  One week prior to surgery beginning 02/09/22 : Stop Anti-inflammatories (NSAIDS) such as Advil, Aleve, Ibuprofen, Motrin, Naproxen, Naprosyn and Aspirin based products such as Excedrin, Goody's Powder, BC Powder.  Stop ANY OVER THE COUNTER supplements until after surgery beginning 02/09/22.  You may however, continue to take Tylenol if needed for pain up until the day of surgery.  Continue taking all prescribed medications with the exception of the following:  Follow recommendations from Cardiologist or PCP regarding stopping blood thinners.  TAKE THESE  MEDICATIONS THE MORNING OF SURGERY WITH A SIP OF WATER:  NONE  Use inhalers on the day of surgery and bring to the hospital.  No Alcohol for 24 hours before or after surgery.  No Smoking including e-cigarettes for 24 hours before surgery.  No chewable tobacco products for at least 6 hours before surgery.  No nicotine patches on the day of surgery.  Do not use any "recreational" drugs for at least a week (preferably 2 weeks) before your surgery.  Please be advised that the combination of cocaine and anesthesia may have negative outcomes, up to and including death. If you test positive for cocaine, your surgery will be cancelled.  On the morning of surgery brush your teeth with toothpaste and water, you may rinse your mouth with mouthwash if you wish. Do not swallow any toothpaste or mouthwash.  Use CHG Soap or wipes as directed on instruction sheet.  Do not wear jewelry, make-up, hairpins, clips or nail polish.  Do not wear lotions, powders, or perfumes.   Do not shave body hair from the neck down 48 hours before surgery.  Contact lenses, hearing aids and dentures may not be worn into surgery.  Do not bring valuables to the hospital. [facility] is not responsible for any missing/lost belongings or valuables.    Notify your doctor if there is any change in your medical condition (cold, fever, infection).  Wear comfortable clothing (specific to your surgery type) to the hospital.  After surgery, you can help prevent lung complications by doing breathing exercises.  Take deep breaths and cough every  1-2 hours. Your doctor may order a device called an Incentive Spirometer to help you take deep breaths. When coughing or sneezing, hold a pillow firmly against your incision with both hands. This is called "splinting." Doing this helps protect your incision. It also decreases belly discomfort.  If you are being admitted to the hospital overnight, leave your suitcase in the car. After  surgery it may be brought to your room.  In case of increased patient census, it may be necessary for you, the patient, to continue your postoperative care in the Same Day Surgery department.  If you are being discharged the day of surgery, you will not be allowed to drive home. You will need a responsible individual to drive you home and stay with you for 24 hours after surgery.   If you are taking public transportation, you will need to have a responsible individual with you.  Please call the [facility] Dept. at [phone] if you have any questions about these instructions.  Surgery Visitation Policy:  Patients undergoing a surgery or procedure may have two family members or support persons with them as long as the person is not COVID-19 positive or experiencing its symptoms.   Inpatient Visitation:    Visiting hours are 7 a.m. to 8 p.m. Up to four visitors are allowed at one time in a patient room. The visitors may rotate out with other people during the day. One designated support person (adult) may remain overnight.  Due to an increase in RSV and influenza rates and associated hospitalizations, children ages 44 and under will not be able to visit patients in [facility]. Masks continue to be strongly recommended.      Preparing for Surgery with CHLORHEXIDINE GLUCONATE (CHG) Soap  Chlorhexidine Gluconate (CHG) Soap  o An antiseptic cleaner that kills germs and bonds with the skin to continue killing germs even after washing  o Used for showering the night before surgery and morning of surgery  Before surgery, you can play an important role by reducing the number of germs on your skin.  CHG (Chlorhexidine gluconate) soap is an antiseptic cleanser which kills germs and bonds with the skin to continue killing germs even after washing.  Please do not use if you have an allergy to CHG or antibacterial soaps. If your skin becomes reddened/irritated stop  using the CHG.  1. Shower the NIGHT BEFORE SURGERY and the MORNING OF SURGERY with CHG soap.  2. If you choose to wash your hair, wash your hair first as usual with your normal shampoo.  3. After shampooing, rinse your hair and body thoroughly to remove the shampoo.  4. Use CHG as you would any other liquid soap. You can apply CHG directly to the skin and wash gently with a scrungie or a clean washcloth.  5. Apply the CHG soap to your body only from the neck down. Do not use on open wounds or open sores. Avoid contact with your eyes, ears, mouth, and genitals (private parts). Wash face and genitals (private parts) with your normal soap.  6. Wash thoroughly, paying special attention to the area where your surgery will be performed.  7. Thoroughly rinse your body with warm water.  8. Do not shower/wash with your normal soap after using and rinsing off the CHG soap.  9. Pat yourself dry with a clean towel.  10. Wear clean pajamas to bed the night before surgery.  12. Place clean sheets on your bed the night of  your first shower and do not sleep with pets.  13. Shower again with the CHG soap on the day of surgery prior to arriving at the hospital.  14. Do not apply any deodorants/lotions/powders.  15. Please wear clean clothes to the hospital.  Please go to the following website to access important education materials concerning your upcoming joint replacement.                                    [email or web address]

## 2022-02-07 DIAGNOSIS — M1712 Unilateral primary osteoarthritis, left knee: Secondary | ICD-10-CM | POA: Diagnosis not present

## 2022-02-10 NOTE — H&P (Signed)
ORTHOPAEDIC HISTORY & PHYSICAL Regino Bellow, PA - 02/07/2022 10:00 AM EST Formatting of this note is different from the original. Images from the original note were not included. Chief Complaint Chief Complaint Patient presents with Knee Pain H & P LEFT KNEE  Reason for Visit Anita Stone is a 67 y.o. who presents today for history and physical. She is to undergo a left total knee arthroplasty on 02/16/2022. Since her last visit here to clinic she has not seen any improvement in her condition and desires to proceed with surgery.  She reports a 2 year history of left knee pain. She localizes most of the pain along the medial aspect of the knee. She reports some swelling, no locking, and some giving way of the knee. The pain is aggravated by any weight bearing and going up and down stairs. The knee pain limits the patient's ability to ambulate long distances. The patient has not appreciated any significant improvement despite Tylenol, topical and oral NSAIDs, cold therapy, intraarticular corticosteroid injections, and activity modification. She is not using any ambulatory aids. The patient states that the knee pain has progressed to the point that it is significantly interfering with her activities of daily living.  Past Medical History Past Medical History: Diagnosis Date Arthritis Colon polyp Melanoma (CMS-HCC) Arm, Left Knee Migraines Mild scoliosis with disc disease Trochanteric bursitis  Past Surgical History Past Surgical History: Procedure Laterality Date thyroductal cyst 1988 HYSTERECTOMY 2007 bleeding,endometriosis frozen shoulder Right 2008 ARTHROSCOPY WRIST W/INT FIXATION FOR FRACTURE/INSTABILITY Right 2014 s/p fall COLONOSCOPY 04/26/2014 Sessile Serrated Adenoma: CBF 04/2017; Recall Ltr mailed 03/01/2017 (dh) POLYPECTOMY 04/26/2014 COLONOSCOPY 10/17/2017 Adenomatous Polyp: CBF 10/2022  Past Family History Family History Problem Relation Age of Onset Breast  cancer Mother 35 Osteoporosis (Thinning of bones) Mother Melanoma Mother High blood pressure (Hypertension) Father Diabetes type II Father Cancer Father 31 throat/x smoker/drinker High blood pressure (Hypertension) Brother Colon polyps Neg Hx Colon cancer Neg Hx  Medications Current Outpatient Medications Ordered in Epic Medication Sig Dispense Refill calcium carbonate 300 mg (750 mg) chewable tablet Take 300 mg of elemental by mouth once as needed for Heartburn cholecalciferol 1000 unit tablet Take 1 tablet by mouth once daily cyanocobalamin 2000 MCG tablet Take 1,000 mcg by mouth once daily diclofenac (VOLTAREN) 1 % topical gel APPLY 2 GRAMS TO AFFECTED AREA 4 TIMES A DAY 300 g 5 meloxicam (MOBIC) 15 MG tablet TAKE 1 TABLET (15 MG TOTAL) BY MOUTH ONCE DAILY AS NEEDED FOR PAIN (TAKE 5-7 DAYS AS NEEDED) 30 tablet 2 omega 3-dha-epa-fish oil (FISH OIL) 1,000 mg (120 mg-180 mg) Cap Take by mouth  No current Epic-ordered facility-administered medications on file.  Allergies No Known Allergies  Review of Systems A comprehensive 14 point ROS was performed, reviewed, and the pertinent orthopaedic findings are documented in the HPI.  Exam BP (!) 130/90 (BP Location: Left upper arm, Patient Position: Sitting, BP Cuff Size: Large Adult)  Ht 165.1 cm (5' 5"$ )  Wt 75.2 kg (165 lb 12.8 oz)  BMI 27.59 kg/m  General: Well-developed well-nourished female seen in no acute distress.  HEENT: Atraumatic,normocephalic. Pupils are equal and reactive to light. Oropharynx is clear with moist mucosa  Lungs: Clear to auscultation bilaterally  Cardiovascular: Regular rate and rhythm. Normal S1, S2. No murmurs. No appreciable gallops or rubs. Peripheral pulses are palpable.  Abdomen: Soft, non-tender, nondistended. Bowel sounds present  Extremity: Left Knee: Soft tissue swelling: minimal Effusion: none Erythema: none Crepitance: mild Tenderness: medial Alignment: relative  varus Mediolateral laxity:  medial pseudolaxity Posterior sag: negative Patellar tracking: Good tracking without evidence of subluxation or tilt Atrophy: No significant atrophy. Quadriceps tone was fair to good. Range of motion: 0/0/120 degrees  Neurological:  The patient is alert and oriented Sensation to light touch appears to be intact and within normal limits Gross motor strength appeared to be equal to 5/5  Vascular :  Peripheral pulses felt to be palpable. Capillary refill appears to be intact and within normal limits  X-ray  1. X-rays taken on 12/14/2021 shows significant narrowing of the medial cartilage space with near bone-on-bone articulation and associated varus alignment. X-ray does confirm osteophyte formation as well as subchondral sclerosis. No evidence of fracture or dislocation.  Impression  1. Degenerative arthrosis left knee  Plan  1. I did give the patient medication list on today's visit 2. Past medical history was reviewed 3. Discussed postop rehab course 4. Return to clinic 2 weeks postop. Sooner if any problems  This note was generated in part with voice recognition software and I apologize for any typographical errors that were not detected and corrected   Watt Climes PA Electronically signed by Regino Bellow, PA at 02/07/2022 10:24 AM EST

## 2022-02-15 MED ORDER — TRANEXAMIC ACID-NACL 1000-0.7 MG/100ML-% IV SOLN
1000.0000 mg | INTRAVENOUS | Status: AC
  Administered 2022-02-16: 1000 mg via INTRAVENOUS

## 2022-02-15 MED ORDER — CHLORHEXIDINE GLUCONATE 0.12 % MT SOLN: 15.0000 mL | Freq: Once | OROMUCOSAL | Status: AC

## 2022-02-15 MED ORDER — ORAL CARE MOUTH RINSE: 15.0000 mL | Freq: Once | OROMUCOSAL | Status: AC

## 2022-02-15 MED ORDER — LACTATED RINGERS IV SOLN: INTRAVENOUS | Status: DC

## 2022-02-15 MED ORDER — CELECOXIB 200 MG PO CAPS: 400.0000 mg | ORAL_CAPSULE | Freq: Once | ORAL | Status: AC

## 2022-02-15 MED ORDER — DEXAMETHASONE SODIUM PHOSPHATE 10 MG/ML IJ SOLN: 8.0000 mg | Freq: Once | INTRAMUSCULAR | Status: AC

## 2022-02-15 MED ORDER — GABAPENTIN 300 MG PO CAPS: 300.0000 mg | ORAL_CAPSULE | Freq: Once | ORAL | Status: AC

## 2022-02-15 MED ORDER — FAMOTIDINE 20 MG PO TABS: 20.0000 mg | ORAL_TABLET | Freq: Once | ORAL | Status: AC

## 2022-02-15 MED ORDER — CHLORHEXIDINE GLUCONATE 4 % EX LIQD
60.0000 mL | Freq: Once | CUTANEOUS | Status: AC
  Administered 2022-02-16: 4 via TOPICAL

## 2022-02-15 MED ORDER — CEFAZOLIN SODIUM-DEXTROSE 2-4 GM/100ML-% IV SOLN
2.0000 g | INTRAVENOUS | Status: AC
  Administered 2022-02-16: 2 g via INTRAVENOUS

## 2022-02-15 NOTE — Anesthesia Preprocedure Evaluation (Addendum)
Anesthesia Evaluation  Patient identified by MRN, date of birth, ID band Patient awake    Reviewed: Allergy & Precautions, H&P , NPO status , Patient's Chart, lab work & pertinent test results  Airway Mallampati: II  TM Distance: >3 FB Neck ROM: full    Dental no notable dental hx.    Pulmonary neg pulmonary ROS   Pulmonary exam normal        Cardiovascular negative cardio ROS Normal cardiovascular exam     Neuro/Psych negative neurological ROS  negative psych ROS   GI/Hepatic Neg liver ROS,GERD  ,,  Endo/Other  negative endocrine ROS    Renal/GU      Musculoskeletal  (+) Arthritis ,    Abdominal Normal abdominal exam  (+)   Peds  Hematology negative hematology ROS (+)   Anesthesia Other Findings Past Medical History: No date: Arthritis 05/03/2020: Basal cell carcinoma     Comment:  R upper sternum 2016: Cancer ([place])     Comment:  melanoma 05/03/2020: Dysplastic nevus     Comment:  L abdomen, moderate 02/17/2018: History of dysplastic nevus     Comment:  left lower back, moderate 03/11/2017: History of dysplastic nevus     Comment:  spinal upper back, severe / excision 04/08/2017 12/04/2007: History of dysplastic nevus     Comment:  left upper back, mod to severe / excised 01/22/2008 03/11/2017: History of melanoma     Comment:  left popliteal, Clark's level 3, Breslow's Depth 0.24m /              excised 03/18/2017 10/25/2014: History of melanoma in situ     Comment:  right elbow / excision 11/01/2014 09/20/2014: History of squamous cell carcinoma     Comment:  left clavicle 03/18/2017: [place]     Comment:  Left popliteal, Clarks level 3, Breslow's 0.587m09/19/2016: Squamous cell carcinoma of skin     Comment:  left clavicle  Past Surgical History: No date: ABDOMINAL HYSTERECTOMY     Reproductive/Obstetrics negative OB ROS                             Anesthesia  Physical Anesthesia Plan  ASA: 2  Anesthesia Plan: Spinal   Post-op Pain Management: Minimal or no pain anticipated   Induction: Intravenous  PONV Risk Score and Plan: Propofol infusion  Airway Management Planned: Natural Airway  Additional Equipment:   Intra-op Plan:   Post-operative Plan:   Informed Consent: I have reviewed the patients History and Physical, chart, labs and discussed the procedure including the risks, benefits and alternatives for the proposed anesthesia with the patient or authorized representative who has indicated his/her understanding and acceptance.     Dental Advisory Given  Plan Discussed with: CRNA and Surgeon  Anesthesia Plan Comments:         Anesthesia Quick Evaluation

## 2022-02-16 ENCOUNTER — Observation Stay: Payer: PPO

## 2022-02-16 ENCOUNTER — Other Ambulatory Visit: Payer: Self-pay

## 2022-02-16 ENCOUNTER — Encounter
Admission: RE | Disposition: A | Discharge status: Home Health | Payer: Self-pay | Source: Ambulatory Visit | Attending: Orthopedic Surgery

## 2022-02-16 ENCOUNTER — Observation Stay
Admission: RE | Admit: 2022-02-16 | Discharge: 2022-02-17 | Disposition: A | Discharge status: Home Health | Payer: PPO | Source: Ambulatory Visit | Attending: Orthopedic Surgery | Admitting: Orthopedic Surgery

## 2022-02-16 ENCOUNTER — Ambulatory Visit: Payer: PPO | Admitting: Anesthesiology

## 2022-02-16 ENCOUNTER — Encounter: Payer: Self-pay | Admitting: Orthopedic Surgery

## 2022-02-16 ENCOUNTER — Ambulatory Visit: Payer: PPO | Admitting: Urgent Care

## 2022-02-16 DIAGNOSIS — C439 Malignant melanoma of skin, unspecified: Secondary | ICD-10-CM | POA: Insufficient documentation

## 2022-02-16 DIAGNOSIS — Z471 Aftercare following joint replacement surgery: Secondary | ICD-10-CM | POA: Diagnosis not present

## 2022-02-16 DIAGNOSIS — Z85828 Personal history of other malignant neoplasm of skin: Secondary | ICD-10-CM | POA: Diagnosis not present

## 2022-02-16 DIAGNOSIS — M1712 Unilateral primary osteoarthritis, left knee: Principal | ICD-10-CM | POA: Insufficient documentation

## 2022-02-16 DIAGNOSIS — G43909 Migraine, unspecified, not intractable, without status migrainosus: Secondary | ICD-10-CM | POA: Diagnosis not present

## 2022-02-16 DIAGNOSIS — Z96652 Presence of left artificial knee joint: Secondary | ICD-10-CM | POA: Diagnosis not present

## 2022-02-16 DIAGNOSIS — Z96659 Presence of unspecified artificial knee joint: Secondary | ICD-10-CM

## 2022-02-16 HISTORY — PX: KNEE ARTHROPLASTY: SHX992

## 2022-02-16 LAB — ABO/RH: ABO/RH(D): AB POS

## 2022-02-16 SURGERY — ARTHROPLASTY, KNEE, TOTAL, USING IMAGELESS COMPUTER-ASSISTED NAVIGATION
Anesthesia: Spinal | Site: Knee | Laterality: Left

## 2022-02-16 MED ORDER — ONDANSETRON HCL 4 MG PO TABS: 4.0000 mg | ORAL_TABLET | Freq: Four times a day (QID) | ORAL | Status: DC | PRN

## 2022-02-16 MED ORDER — SODIUM CHLORIDE (PF) 0.9 % IJ SOLN
INTRAMUSCULAR | Status: DC | PRN
  Administered 2022-02-16: 120 mL via INTRAMUSCULAR

## 2022-02-16 MED ORDER — GABAPENTIN 300 MG PO CAPS
ORAL_CAPSULE | ORAL | Status: AC
  Administered 2022-02-16: 300 mg via ORAL
  Filled 2022-02-16: qty 1

## 2022-02-16 MED ORDER — BUPIVACAINE HCL (PF) 0.5 % IJ SOLN
INTRAMUSCULAR | Status: DC | PRN
  Administered 2022-02-16: 3 mL

## 2022-02-16 MED ORDER — MAGNESIUM HYDROXIDE 400 MG/5ML PO SUSP
30.0000 mL | Freq: Every day | ORAL | Status: DC
  Administered 2022-02-16 – 2022-02-17 (×2): 30 mL via ORAL
  Filled 2022-02-16 (×2): qty 30

## 2022-02-16 MED ORDER — OXYCODONE HCL 5 MG PO TABS
10.0000 mg | ORAL_TABLET | ORAL | Status: DC | PRN
  Administered 2022-02-16: 10 mg via ORAL
  Filled 2022-02-16 (×2): qty 2

## 2022-02-16 MED ORDER — ACETAMINOPHEN 10 MG/ML IV SOLN: 1000.0000 mg | Freq: Once | INTRAVENOUS | Status: DC | PRN

## 2022-02-16 MED ORDER — ACETAMINOPHEN 10 MG/ML IV SOLN
INTRAVENOUS | Status: DC | PRN
  Administered 2022-02-16: 1000 mg via INTRAVENOUS

## 2022-02-16 MED ORDER — METOCLOPRAMIDE HCL 5 MG/ML IJ SOLN
10.0000 mg | Freq: Once | INTRAMUSCULAR | Status: AC
  Administered 2022-02-16: 10 mg via INTRAVENOUS

## 2022-02-16 MED ORDER — PHENYLEPHRINE HCL-NACL 20-0.9 MG/250ML-% IV SOLN
INTRAVENOUS | Status: AC
  Filled 2022-02-16: qty 250

## 2022-02-16 MED ORDER — BUPIVACAINE HCL (PF) 0.5 % IJ SOLN
INTRAMUSCULAR | Status: AC
  Filled 2022-02-16: qty 10

## 2022-02-16 MED ORDER — FERROUS SULFATE 325 (65 FE) MG PO TABS
325.0000 mg | ORAL_TABLET | Freq: Two times a day (BID) | ORAL | Status: DC
  Administered 2022-02-16 – 2022-02-17 (×2): 325 mg via ORAL
  Filled 2022-02-16 (×2): qty 1

## 2022-02-16 MED ORDER — ONDANSETRON HCL 4 MG/2ML IJ SOLN: 4.0000 mg | Freq: Four times a day (QID) | INTRAMUSCULAR | Status: DC | PRN

## 2022-02-16 MED ORDER — TRAMADOL HCL 50 MG PO TABS
50.0000 mg | ORAL_TABLET | ORAL | Status: DC | PRN
  Administered 2022-02-16: 50 mg via ORAL
  Filled 2022-02-16: qty 1

## 2022-02-16 MED ORDER — DEXAMETHASONE SODIUM PHOSPHATE 10 MG/ML IJ SOLN
INTRAMUSCULAR | Status: AC
  Administered 2022-02-16: 8 mg via INTRAVENOUS
  Filled 2022-02-16: qty 1

## 2022-02-16 MED ORDER — PHENOL 1.4 % MT LIQD: 1.0000 | OROMUCOSAL | Status: DC | PRN

## 2022-02-16 MED ORDER — SENNOSIDES-DOCUSATE SODIUM 8.6-50 MG PO TABS
1.0000 | ORAL_TABLET | Freq: Two times a day (BID) | ORAL | Status: DC
  Administered 2022-02-16 – 2022-02-17 (×2): 1 via ORAL
  Filled 2022-02-16 (×2): qty 1

## 2022-02-16 MED ORDER — SODIUM CHLORIDE 0.9 % IV SOLN: INTRAVENOUS | Status: DC

## 2022-02-16 MED ORDER — ACETAMINOPHEN 10 MG/ML IV SOLN
INTRAVENOUS | Status: AC
  Filled 2022-02-16: qty 100

## 2022-02-16 MED ORDER — OXYCODONE HCL 5 MG/5ML PO SOLN: 5.0000 mg | Freq: Once | ORAL | Status: DC | PRN

## 2022-02-16 MED ORDER — SODIUM CHLORIDE 0.9 % IR SOLN
Status: DC | PRN
  Administered 2022-02-16: 3000 mL

## 2022-02-16 MED ORDER — HYDROMORPHONE HCL 1 MG/ML IJ SOLN
0.5000 mg | INTRAMUSCULAR | Status: DC | PRN
  Administered 2022-02-16 – 2022-02-17 (×3): 1 mg via INTRAVENOUS
  Filled 2022-02-16 (×3): qty 1

## 2022-02-16 MED ORDER — ACETAMINOPHEN 10 MG/ML IV SOLN
1000.0000 mg | Freq: Four times a day (QID) | INTRAVENOUS | Status: DC
  Administered 2022-02-16 – 2022-02-17 (×3): 1000 mg via INTRAVENOUS
  Filled 2022-02-16 (×3): qty 100

## 2022-02-16 MED ORDER — MENTHOL 3 MG MT LOZG: 1.0000 | LOZENGE | OROMUCOSAL | Status: DC | PRN

## 2022-02-16 MED ORDER — TRANEXAMIC ACID-NACL 1000-0.7 MG/100ML-% IV SOLN
INTRAVENOUS | Status: AC
  Filled 2022-02-16: qty 100

## 2022-02-16 MED ORDER — BISACODYL 10 MG RE SUPP: 10.0000 mg | Freq: Every day | RECTAL | Status: DC | PRN

## 2022-02-16 MED ORDER — METOCLOPRAMIDE HCL 5 MG/ML IJ SOLN
INTRAMUSCULAR | Status: AC
  Filled 2022-02-16: qty 2

## 2022-02-16 MED ORDER — TRANEXAMIC ACID-NACL 1000-0.7 MG/100ML-% IV SOLN: 1000.0000 mg | Freq: Once | INTRAVENOUS | Status: AC

## 2022-02-16 MED ORDER — PROPOFOL 500 MG/50ML IV EMUL
INTRAVENOUS | Status: DC | PRN
  Administered 2022-02-16: 75 ug/kg/min via INTRAVENOUS

## 2022-02-16 MED ORDER — CELECOXIB 200 MG PO CAPS
200.0000 mg | ORAL_CAPSULE | Freq: Two times a day (BID) | ORAL | Status: DC
  Administered 2022-02-16 – 2022-02-17 (×2): 200 mg via ORAL
  Filled 2022-02-16 (×2): qty 1

## 2022-02-16 MED ORDER — DROPERIDOL 2.5 MG/ML IJ SOLN: 0.6250 mg | Freq: Once | INTRAMUSCULAR | Status: DC | PRN

## 2022-02-16 MED ORDER — CHLORHEXIDINE GLUCONATE 0.12 % MT SOLN
OROMUCOSAL | Status: AC
  Administered 2022-02-16: 15 mL via OROMUCOSAL
  Filled 2022-02-16: qty 15

## 2022-02-16 MED ORDER — FAMOTIDINE 20 MG PO TABS
ORAL_TABLET | ORAL | Status: AC
  Administered 2022-02-16: 20 mg via ORAL
  Filled 2022-02-16: qty 1

## 2022-02-16 MED ORDER — TRANEXAMIC ACID-NACL 1000-0.7 MG/100ML-% IV SOLN
INTRAVENOUS | Status: AC
  Administered 2022-02-16: 1000 mg via INTRAVENOUS
  Filled 2022-02-16: qty 100

## 2022-02-16 MED ORDER — FENTANYL CITRATE (PF) 100 MCG/2ML IJ SOLN
INTRAMUSCULAR | Status: AC
  Filled 2022-02-16: qty 2

## 2022-02-16 MED ORDER — PROMETHAZINE HCL 25 MG/ML IJ SOLN: 6.2500 mg | INTRAMUSCULAR | Status: DC | PRN

## 2022-02-16 MED ORDER — ENSURE PRE-SURGERY PO LIQD
296.0000 mL | Freq: Once | ORAL | Status: DC
  Filled 2022-02-16: qty 296

## 2022-02-16 MED ORDER — CELECOXIB 200 MG PO CAPS
ORAL_CAPSULE | ORAL | Status: AC
  Administered 2022-02-16: 400 mg via ORAL
  Filled 2022-02-16: qty 2

## 2022-02-16 MED ORDER — MIDAZOLAM HCL 5 MG/5ML IJ SOLN
INTRAMUSCULAR | Status: DC | PRN
  Administered 2022-02-16: 2 mg via INTRAVENOUS

## 2022-02-16 MED ORDER — ENOXAPARIN SODIUM 30 MG/0.3ML IJ SOSY
30.0000 mg | PREFILLED_SYRINGE | Freq: Two times a day (BID) | INTRAMUSCULAR | Status: DC
  Administered 2022-02-17: 30 mg via SUBCUTANEOUS
  Filled 2022-02-16: qty 0.3

## 2022-02-16 MED ORDER — OXYCODONE HCL 5 MG PO TABS: 5.0000 mg | ORAL_TABLET | Freq: Once | ORAL | Status: DC | PRN

## 2022-02-16 MED ORDER — FLEET ENEMA 7-19 GM/118ML RE ENEM: 1.0000 | ENEMA | Freq: Once | RECTAL | Status: DC | PRN

## 2022-02-16 MED ORDER — ALUM & MAG HYDROXIDE-SIMETH 200-200-20 MG/5ML PO SUSP: 30.0000 mL | ORAL | Status: DC | PRN

## 2022-02-16 MED ORDER — MIDAZOLAM HCL 2 MG/2ML IJ SOLN
INTRAMUSCULAR | Status: AC
  Filled 2022-02-16: qty 2

## 2022-02-16 MED ORDER — PHENYLEPHRINE HCL-NACL 20-0.9 MG/250ML-% IV SOLN
INTRAVENOUS | Status: DC | PRN
  Administered 2022-02-16: 40 ug/min via INTRAVENOUS

## 2022-02-16 MED ORDER — PROPOFOL 1000 MG/100ML IV EMUL
INTRAVENOUS | Status: AC
  Filled 2022-02-16: qty 100

## 2022-02-16 MED ORDER — METOCLOPRAMIDE HCL 5 MG PO TABS
10.0000 mg | ORAL_TABLET | Freq: Three times a day (TID) | ORAL | Status: DC
  Administered 2022-02-16 – 2022-02-17 (×2): 10 mg via ORAL
  Filled 2022-02-16 (×2): qty 2

## 2022-02-16 MED ORDER — DIPHENHYDRAMINE HCL 12.5 MG/5ML PO ELIX: 12.5000 mg | ORAL_SOLUTION | ORAL | Status: DC | PRN

## 2022-02-16 MED ORDER — FENTANYL CITRATE (PF) 100 MCG/2ML IJ SOLN: 25.0000 ug | INTRAMUSCULAR | Status: DC | PRN

## 2022-02-16 MED ORDER — IRRISEPT - 450ML BOTTLE WITH 0.05% CHG IN STERILE WATER, USP 99.95% OPTIME
TOPICAL | Status: DC | PRN
  Administered 2022-02-16: 450 mL

## 2022-02-16 MED ORDER — OXYCODONE HCL 5 MG PO TABS
5.0000 mg | ORAL_TABLET | ORAL | Status: DC | PRN
  Administered 2022-02-16 – 2022-02-17 (×2): 5 mg via ORAL
  Filled 2022-02-16: qty 1

## 2022-02-16 MED ORDER — PANTOPRAZOLE SODIUM 40 MG PO TBEC
40.0000 mg | DELAYED_RELEASE_TABLET | Freq: Two times a day (BID) | ORAL | Status: DC
  Administered 2022-02-16 – 2022-02-17 (×2): 40 mg via ORAL
  Filled 2022-02-16 (×2): qty 1

## 2022-02-16 MED ORDER — CEFAZOLIN SODIUM-DEXTROSE 2-4 GM/100ML-% IV SOLN
2.0000 g | Freq: Four times a day (QID) | INTRAVENOUS | Status: AC
  Administered 2022-02-16 (×2): 2 g via INTRAVENOUS
  Filled 2022-02-16 (×2): qty 100

## 2022-02-16 MED ORDER — ACETAMINOPHEN 325 MG PO TABS: 325.0000 mg | ORAL_TABLET | Freq: Four times a day (QID) | ORAL | Status: DC | PRN

## 2022-02-16 MED ORDER — CEFAZOLIN SODIUM-DEXTROSE 2-4 GM/100ML-% IV SOLN
INTRAVENOUS | Status: AC
  Filled 2022-02-16: qty 100

## 2022-02-16 SURGICAL SUPPLY — 71 items
ATTUNE MED DOME PAT 38 KNEE (Knees) IMPLANT
ATTUNE PSFEM LTSZ5 NARCEM KNEE (Femur) IMPLANT
ATTUNE PSRP INSR SZ5 5 KNEE (Insert) IMPLANT
BASEPLATE TIBIAL ROTATING SZ 4 (Knees) IMPLANT
BATTERY INSTRU NAVIGATION (MISCELLANEOUS) ×4 IMPLANT
BLADE CLIPPER SURG (BLADE) IMPLANT
BLADE SAW 70X12.5 (BLADE) ×1 IMPLANT
BLADE SAW 90X13X1.19 OSCILLAT (BLADE) ×1 IMPLANT
BLADE SAW 90X25X1.19 OSCILLAT (BLADE) ×1 IMPLANT
CEMENT HV SMART SET (Cement) IMPLANT
COOLER POLAR GLACIER W/PUMP (MISCELLANEOUS) ×1 IMPLANT
CUFF TOURN SGL QUICK 24 (TOURNIQUET CUFF)
CUFF TOURN SGL QUICK 34 (TOURNIQUET CUFF)
CUFF TRNQT CYL 24X4X16.5-23 (TOURNIQUET CUFF) IMPLANT
CUFF TRNQT CYL 34X4.125X (TOURNIQUET CUFF) IMPLANT
DRAPE 3/4 80X56 (DRAPES) ×1 IMPLANT
DRAPE INCISE IOBAN 66X45 STRL (DRAPES) IMPLANT
DRSG MEPILEX SACRM 8.7X9.8 (GAUZE/BANDAGES/DRESSINGS) ×1 IMPLANT
DRSG NON-ADHERENT DERMACEA 3X4 (GAUZE/BANDAGES/DRESSINGS) ×1 IMPLANT
DRSG OPSITE POSTOP 4X14 (GAUZE/BANDAGES/DRESSINGS) ×1 IMPLANT
DRSG TEGADERM 4X4.75 (GAUZE/BANDAGES/DRESSINGS) ×1 IMPLANT
DURAPREP 26ML APPLICATOR (WOUND CARE) ×2 IMPLANT
ELECT CAUTERY BLADE 6.4 (BLADE) ×1 IMPLANT
ELECT REM PT RETURN 9FT ADLT (ELECTROSURGICAL) ×1
ELECTRODE REM PT RTRN 9FT ADLT (ELECTROSURGICAL) ×1 IMPLANT
EX-PIN ORTHOLOCK NAV 4X150 (PIN) ×2 IMPLANT
GLOVE BIOGEL M STRL SZ7.5 (GLOVE) ×2 IMPLANT
GLOVE SRG 8 PF TXTR STRL LF DI (GLOVE) ×1 IMPLANT
GLOVE SURG UNDER POLY LF SZ8 (GLOVE) ×1
GOWN STRL REUS W/ TWL LRG LVL3 (GOWN DISPOSABLE) ×1 IMPLANT
GOWN STRL REUS W/TWL LRG LVL3 (GOWN DISPOSABLE) ×1
GOWN TOGA ZIPPER T7+ PEEL AWAY (MISCELLANEOUS) ×1 IMPLANT
HANDLE YANKAUER SUCT OPEN TIP (MISCELLANEOUS) IMPLANT
HEMOVAC 400CC 10FR (MISCELLANEOUS) ×1 IMPLANT
HOLDER FOLEY CATH W/STRAP (MISCELLANEOUS) ×1 IMPLANT
HOOD PEEL AWAY T7 (MISCELLANEOUS) IMPLANT
IV NS IRRIG 3000ML ARTHROMATIC (IV SOLUTION) ×1 IMPLANT
JET LAVAGE IRRISEPT WOUND (IRRIGATION / IRRIGATOR) ×1
KIT TURNOVER KIT A (KITS) ×1 IMPLANT
KNIFE SCULPS 14X20 (INSTRUMENTS) ×1 IMPLANT
LAVAGE JET IRRISEPT WOUND (IRRIGATION / IRRIGATOR) IMPLANT
MANIFOLD NEPTUNE II (INSTRUMENTS) ×2 IMPLANT
NDL SPNL 20GX3.5 QUINCKE YW (NEEDLE) ×2 IMPLANT
NEEDLE SPNL 20GX3.5 QUINCKE YW (NEEDLE) ×2 IMPLANT
NS IRRIG 500ML POUR BTL (IV SOLUTION) ×1 IMPLANT
PACK TOTAL KNEE (MISCELLANEOUS) ×1 IMPLANT
PAD ABD DERMACEA PRESS 5X9 (GAUZE/BANDAGES/DRESSINGS) ×2 IMPLANT
PAD WRAPON POLAR KNEE (MISCELLANEOUS) ×1 IMPLANT
PIN DRILL FIX HALF THREAD (BIT) ×2 IMPLANT
PIN FIXATION 1/8DIA X 3INL (PIN) ×1 IMPLANT
PULSAVAC PLUS IRRIG FAN TIP (DISPOSABLE) ×1
SOL PREP PVP 2OZ (MISCELLANEOUS) ×1
SOLUTION IRRIG SURGIPHOR (IV SOLUTION) ×1 IMPLANT
SOLUTION PREP PVP 2OZ (MISCELLANEOUS) ×1 IMPLANT
SPONGE DRAIN TRACH 4X4 STRL 2S (GAUZE/BANDAGES/DRESSINGS) ×1 IMPLANT
STAPLER SKIN PROX 35W (STAPLE) ×1 IMPLANT
STOCKINETTE IMPERV 14X48 (MISCELLANEOUS) ×1 IMPLANT
STRAP TIBIA SHORT (MISCELLANEOUS) ×1 IMPLANT
SUCTION FRAZIER HANDLE 10FR (MISCELLANEOUS) ×1
SUCTION TUBE FRAZIER 10FR DISP (MISCELLANEOUS) ×1 IMPLANT
SUT VIC AB 0 CT1 36 (SUTURE) ×1 IMPLANT
SUT VIC AB 1 CT1 36 (SUTURE) ×2 IMPLANT
SUT VIC AB 2-0 CT2 27 (SUTURE) ×1 IMPLANT
SYR 30ML LL (SYRINGE) ×2 IMPLANT
TIP FAN IRRIG PULSAVAC PLUS (DISPOSABLE) ×1 IMPLANT
TOWEL OR 17X26 4PK STRL BLUE (TOWEL DISPOSABLE) IMPLANT
TOWER CARTRIDGE SMART MIX (DISPOSABLE) ×1 IMPLANT
TRAP FLUID SMOKE EVACUATOR (MISCELLANEOUS) ×1 IMPLANT
TRAY FOLEY MTR SLVR 16FR STAT (SET/KITS/TRAYS/PACK) ×1 IMPLANT
WATER STERILE IRR 1000ML POUR (IV SOLUTION) ×1 IMPLANT
WRAPON POLAR PAD KNEE (MISCELLANEOUS) ×1

## 2022-02-16 NOTE — Interval H&P Note (Signed)
History and Physical Interval Note:  02/16/2022 10:47 AM  Anita Stone  has presented today for surgery, with the diagnosis of PRIMARY OSTEOARTHRITIS OF LEFT KNEE..  The various methods of treatment have been discussed with the patient and family. After consideration of risks, benefits and other options for treatment, the patient has consented to  Procedure(s): COMPUTER ASSISTED TOTAL KNEE ARTHROPLASTY - RNFA (Left) as a surgical intervention.  The patient's history has been reviewed, patient examined, no change in status, stable for surgery.  I have reviewed the patient's chart and labs.  Questions were answered to the patient's satisfaction.     [place]

## 2022-02-16 NOTE — Op Note (Signed)
OPERATIVE NOTE  DATE OF SURGERY:  02/16/2022  PATIENT NAME:  Anita Stone   DOB: 01-22-1955  MRN: [id number]  PRE-OPERATIVE DIAGNOSIS: Degenerative arthrosis of the left knee, primary  POST-OPERATIVE DIAGNOSIS:  Same  PROCEDURE:  Left total knee arthroplasty using computer-assisted navigation  SURGEON:  Marciano Sequin. M.D.  ANESTHESIA: spinal  ESTIMATED BLOOD LOSS: 50 mL  FLUIDS REPLACED: 1000 mL of crystalloid  TOURNIQUET TIME: 73 minutes  DRAINS: 2 medium Hemovac drains  SOFT TISSUE RELEASES: Anterior cruciate ligament, posterior cruciate ligament, deep medial collateral ligament, patellofemoral ligament  IMPLANTS UTILIZED: DePuy Attune size 5N posterior stabilized femoral component (cemented), size 4 rotating platform tibial component (cemented), 38 mm medialized dome patella (cemented), and a 5 mm stabilized rotating platform polyethylene insert.  INDICATIONS FOR SURGERY: Anita Stone is a 67 y.o. year old female with a long history of progressive knee pain. X-rays demonstrated severe degenerative changes in tricompartmental fashion. The patient had not seen any significant improvement despite conservative nonsurgical intervention. After discussion of the risks and benefits of surgical intervention, the patient expressed understanding of the risks benefits and agree with plans for total knee arthroplasty.   The risks, benefits, and alternatives were discussed at length including but not limited to the risks of infection, bleeding, nerve injury, stiffness, blood clots, the need for revision surgery, cardiopulmonary complications, among others, and they were willing to proceed.  PROCEDURE IN DETAIL: The patient was brought into the operating room and, after adequate spinal anesthesia was achieved, a tourniquet was placed on the patient's upper thigh. The patient's knee and leg were cleaned and prepped with alcohol and DuraPrep and draped in the usual sterile fashion. A  "timeout" was performed as per usual protocol. The lower extremity was exsanguinated using an Esmarch, and the tourniquet was inflated to 300 mmHg. An anterior longitudinal incision was made followed by a standard mid vastus approach. The deep fibers of the medial collateral ligament were elevated in a subperiosteal fashion off of the medial flare of the tibia so as to maintain a continuous soft tissue sleeve. The patella was subluxed laterally and the patellofemoral ligament was incised. Inspection of the knee demonstrated severe degenerative changes with full-thickness loss of articular cartilage. Osteophytes were debrided using a rongeur. Anterior and posterior cruciate ligaments were excised. Two 4.0 mm Schanz pins were inserted in the femur and into the tibia for attachment of the array of trackers used for computer-assisted navigation. Hip center was identified using a circumduction technique. Distal landmarks were mapped using the computer. The distal femur and proximal tibia were mapped using the computer. The distal femoral cutting guide was positioned using computer-assisted navigation so as to achieve a 5 distal valgus cut. The femur was sized and it was felt that a size 5N femoral component was appropriate. A size 5 femoral cutting guide was positioned and the anterior cut was performed and verified using the computer. This was followed by completion of the posterior and chamfer cuts. Femoral cutting guide for the central box was then positioned in the center box cut was performed.  Attention was then directed to the proximal tibia. Medial and lateral menisci were excised. The extramedullary tibial cutting guide was positioned using computer-assisted navigation so as to achieve a 0 varus-valgus alignment and 3 posterior slope. The cut was performed and verified using the computer. The proximal tibia was sized and it was felt that a size 4 tibial tray was appropriate. Tibial and femoral trials were  inserted followed  by insertion of a 5 mm polyethylene insert. This allowed for excellent mediolateral soft tissue balancing both in flexion and in full extension. Finally, the patella was cut and prepared so as to accommodate a 38 mm medialized dome patella. A patella trial was placed and the knee was placed through a range of motion with excellent patellar tracking appreciated. The femoral trial was removed after debridement of posterior osteophytes. The central post-hole for the tibial component was reamed followed by insertion of a keel punch. Tibial trials were then removed. Cut surfaces of bone were irrigated with copious amounts of normal saline using pulsatile lavage and then suctioned dry. Polymethylmethacrylate cement was prepared in the usual fashion using a vacuum mixer. Cement was applied to the cut surface of the proximal tibia as well as along the undersurface of a size 4 rotating platform tibial component. Tibial component was positioned and impacted into place. Excess cement was removed using [employer] fast streamer. Cement was then applied to the cut surfaces of the femur as well as along the posterior flanges of the size 5N femoral component. The femoral component was positioned and impacted into place. Excess cement was removed using [employer] fast streamer. A 5 mm polyethylene trial was inserted and the knee was brought into full extension with steady axial compression applied. Finally, cement was applied to the backside of a 38 mm medialized dome patella and the patellar component was positioned and patellar clamp applied. Excess cement was removed using [employer] fast streamer. After adequate curing of the cement, the tourniquet was deflated after a total tourniquet time of 73 minutes. Hemostasis was achieved using electrocautery. The knee was irrigated with copious amounts of normal saline using pulsatile lavage followed by 450 ml of Surgiphor and then suctioned dry. 20 mL of 1.3% Exparel and 60 mL of 0.25% Marcaine  in 40 mL of normal saline was injected along the posterior capsule, medial and lateral gutters, and along the arthrotomy site. A 5 mm stabilized rotating platform polyethylene insert was inserted and the knee was placed through a range of motion with excellent mediolateral soft tissue balancing appreciated and excellent patellar tracking noted. 2 medium drains were placed in the wound bed and brought out through separate stab incisions. The medial parapatellar portion of the incision was reapproximated using interrupted sutures of #1 Vicryl. Subcutaneous tissue was approximated in layers using first #0 Vicryl followed #2-0 Vicryl. The skin was approximated with skin staples. A sterile dressing was applied.  The patient tolerated the procedure well and was transported to the recovery room in stable condition.    Brodie Correll P. Holley Bouche., M.D.

## 2022-02-16 NOTE — Transfer of Care (Signed)
Immediate Anesthesia Transfer of Care Note  Patient: Anita Stone  Procedure(s) Performed: COMPUTER ASSISTED TOTAL KNEE ARTHROPLAST (Left: Knee)  Patient Location: PACU  Anesthesia Type:Spinal  Level of Consciousness: awake, alert , and oriented  Airway & Oxygen Therapy: Patient Spontanous Breathing  Post-op Assessment: Report given to RN and Post -op Vital signs reviewed and stable  Post vital signs: Reviewed and stable  Last Vitals:  Vitals Value Taken Time  BP    Temp    Pulse    Resp    SpO2      Last Pain:  Vitals:   02/16/22 0953  TempSrc: Temporal  PainSc: 4          Complications: No notable events documented.

## 2022-02-16 NOTE — Anesthesia Procedure Notes (Signed)
Spinal  Patient location during procedure: OR Start time: 02/16/2022 11:20 AM End time: 02/16/2022 11:21 AM Reason for block: surgical anesthesia Staffing Performed: anesthesiologist  Anesthesiologist: Iran Ouch, MD Performed by: Iran Ouch, MD Authorized by: Iran Ouch, MD   Preanesthetic Checklist Completed: patient identified, IV checked, site marked, risks and benefits discussed, surgical consent, monitors and equipment checked, pre-op evaluation and timeout performed Spinal Block Patient position: sitting Prep: DuraPrep Patient monitoring: heart rate, cardiac monitor, continuous pulse ox and blood pressure Approach: midline Location: L3-4 Injection technique: single-shot Needle Needle type: Pencan  Needle gauge: 24 G Needle length: 9 cm Assessment Sensory level: T10 Events: CSF return

## 2022-02-17 DIAGNOSIS — M1712 Unilateral primary osteoarthritis, left knee: Secondary | ICD-10-CM | POA: Diagnosis not present

## 2022-02-17 MED ORDER — CELECOXIB 200 MG PO CAPS: 200.0000 mg | ORAL_CAPSULE | Freq: Two times a day (BID) | ORAL | 0 refills | Status: AC

## 2022-02-17 MED ORDER — OXYCODONE HCL 5 MG PO TABS: 5.0000 mg | ORAL_TABLET | ORAL | 0 refills | Status: AC | PRN

## 2022-02-17 MED ORDER — ACETAMINOPHEN 325 MG PO TABS: 325.0000 mg | ORAL_TABLET | Freq: Four times a day (QID) | ORAL | Status: AC | PRN

## 2022-02-17 MED ORDER — TRAMADOL HCL 50 MG PO TABS: 50.0000 mg | ORAL_TABLET | ORAL | 0 refills | Status: AC | PRN

## 2022-02-17 MED ORDER — ONDANSETRON HCL 4 MG PO TABS: 4.0000 mg | ORAL_TABLET | Freq: Four times a day (QID) | ORAL | 0 refills | Status: AC | PRN

## 2022-02-17 MED ORDER — SENNOSIDES-DOCUSATE SODIUM 8.6-50 MG PO TABS: 1.0000 | ORAL_TABLET | Freq: Two times a day (BID) | ORAL | 0 refills | Status: AC

## 2022-02-17 MED ORDER — ENOXAPARIN SODIUM 40 MG/0.4ML IJ SOSY: 40.0000 mg | PREFILLED_SYRINGE | INTRAMUSCULAR | 0 refills | Status: AC

## 2022-02-17 NOTE — Discharge Summary (Signed)
Physician Discharge Summary  Patient ID: Anita Stone MRN: [id number] DOB/AGE: 01/30/1955 67 y.o.  Admit date: 02/16/2022 Discharge date: 02/17/2022  Admission Diagnoses:  Total knee replacement status [Z96.659]   Discharge Diagnoses: Patient Active Problem List   Diagnosis Date Noted   Melanoma ([facility]) 02/16/2022   Total knee replacement status 02/16/2022   Migraines 12/17/2021   Mild scoliosis 12/17/2021   Primary osteoarthritis of left knee 12/17/2021    Past Medical History:  Diagnosis Date   Arthritis    Basal cell carcinoma 05/03/2020   R upper sternum   Cancer ([facility]) 2016   melanoma   Dysplastic nevus 05/03/2020   L abdomen, moderate   History of dysplastic nevus 02/17/2018   left lower back, moderate   History of dysplastic nevus 03/11/2017   spinal upper back, severe / excision 04/08/2017   History of dysplastic nevus 12/04/2007   left upper back, mod to severe / excised 01/22/2008   History of melanoma 03/11/2017   left popliteal, Clark's level 3, Breslow's Depth 0.27m / excised 03/18/2017   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 female who was admitted 02/16/2022 with a diagnosis of Total knee replacement status and went to the operating room on 02/16/2022 and underwent the above named procedures.    Surgeries: Procedure(s): COMPUTER ASSISTED TOTAL KNEE ARTHROPLAST on 02/16/2022 Patient tolerated the surgery well. Taken to PACU where she was stabilized and then transferred to the orthopedic floor.  Started on Lovenox 30 mg q 12 hrs. TEDs and SCDs applied bilaterally. Heels elevated on bed. No  evidence of DVT. Negative Homan. Physical therapy started on day #1 for gait training and transfer. OT started day #1 for ADL and assisted devices. Patient's IV and hemovac was d/c on day # 1. Patient was able to safely and independently complete all PT goals. PT recommending discharge to home. On post op day # 1 patient was stable and ready for discharge to home with home health PT.  Implants: : DePuy Attune size 5N posterior stabilized femoral component (cemented), size 4 rotating platform tibial component (cemented), 38 mm medialized dome patella (cemented), and a 5 mm stabilized rotating platform polyethylene insert   She was given perioperative antibiotics:  Anti-infectives (From admission, onward)    Start     Dose/Rate Route Frequency Ordered Stop   02/16/22 1730  ceFAZolin (ANCEF) IVPB 2g/100 mL premix        2 g 200 mL/hr over 30 Minutes Intravenous Every 6 hours 02/16/22 1638 02/17/22 0700   02/16/22 0949  ceFAZolin (ANCEF) 2-4 GM/100ML-% IVPB       Note to Pharmacy: MiJeanene Erb: cabinet override      02/16/22 0949 02/16/22 1131   02/16/22 0600  ceFAZolin (ANCEF) IVPB 2g/100 mL premix        2 g 200 mL/hr over 30 Minutes Intravenous On call to O.R. 02/15/22 2300 02/16/22 1131     .  She was given sequential compression devices, early ambulation, and Lovenox, teds for DVT prophylaxis.  She benefited maximally from the hospital stay and there were no complications.  Recent vital signs:  Vitals:   02/17/22 0408 02/17/22 0753  BP: 105/61 129/72  Pulse: 66 61  Resp: 18 16  Temp: 98.1 F (36.7 C) 97.9 F (36.6 C)  SpO2: 95% 96%    Recent laboratory studies:  Lab Results  Component Value Date   HGB 14.6 02/06/2022   Lab Results  Component Value Date   WBC 7.0 02/06/2022   PLT 283 02/06/2022   No results found for: "INR" Lab Results  Component Value Date   NA 139 02/06/2022   K 3.8 02/06/2022   CL 103 02/06/2022   CO2 28 02/06/2022   BUN 10  02/06/2022   CREATININE 0.64 02/06/2022   GLUCOSE 95 02/06/2022    Discharge Medications:   Allergies as of 02/17/2022   No Known Allergies      Medication List     TAKE these medications    acetaminophen 325 MG tablet Commonly known as: TYLENOL Take 1-2 tablets (325-650 mg total) by mouth every 6 (six) hours as needed for mild pain (pain score 1-3 or temp > 100.5). What changed:  how much to take when to take this reasons to take this   calcium carbonate 750 MG chewable tablet Commonly known as: TUMS EX Chew 1 tablet by mouth as needed for heartburn.   celecoxib 200 MG capsule Commonly known as: CELEBREX Take 1 capsule (200 mg total) by mouth 2 (two) times daily for 14 days.   cyanocobalamin 1000 MCG tablet Commonly known as: VITAMIN B12 Take 1,000 mcg by mouth daily.   D3 5000 125 MCG (5000 UT) capsule Generic drug: Cholecalciferol Take 5,000 Units by mouth daily.   diclofenac Sodium 1 % Gel Commonly known as: VOLTAREN Apply 2 g topically as needed.   enoxaparin 40 MG/0.4ML injection Commonly known as: LOVENOX Inject 0.4 mLs (40 mg total) into the skin daily for 14 days.   FISH OIL OMEGA-3 PO Take 1 tablet by mouth daily.   meloxicam 15 MG tablet Commonly known as: MOBIC Take 15 mg by mouth as needed for pain.   ondansetron 4 MG tablet Commonly known as: ZOFRAN Take 1 tablet (4 mg total) by mouth every 6 (six) hours as needed for nausea.   oxyCODONE 5 MG immediate release tablet Commonly known as: Oxy IR/ROXICODONE Take 1 tablet (5 mg total) by mouth every 4 (four) hours as needed for moderate pain (pain score 4-6).   senna-docusate 8.6-50 MG tablet Commonly known as: Senokot-S Take 1 tablet by mouth 2 (two) times daily.   traMADol 50 MG tablet Commonly known as: ULTRAM Take 1-2 tablets (50-100 mg total) by mouth every 4 (four) hours as needed for moderate pain.               Durable Medical Equipment  (From admission, onward)            Start     Ordered   02/16/22 1639  DME Walker rolling  Once       Question:  Patient needs a walker to treat with the following condition  Answer:  Total knee replacement status   02/16/22 1638   02/16/22 1639  DME Bedside commode  Once       Comments: Patient is not able to walk the distance required to go the bathroom, or he/she is unable to safely negotiate stairs required to access the bathroom.  A 3in1 BSC will alleviate this problem  Question:  Patient needs a bedside commode to treat with the  following condition  Answer:  Total knee replacement status   02/16/22 1638            Diagnostic Studies: DG Knee Left Port  Result Date: 02/16/2022 CLINICAL DATA:  Status post left total knee replacement. EXAM: PORTABLE LEFT KNEE - 1-2 VIEW COMPARISON:  None Available. FINDINGS: The left femoral and tibial components are well situated. Expected postoperative changes are seen involving the soft tissues anteriorly, including multiple surgical drains. IMPRESSION: Status post left total knee arthroplasty. Electronically Signed   By: Marijo Conception M.D.   On: 02/16/2022 14:53    Disposition:      Follow-up Information     Watt Climes, PA Follow up on 03/05/2022.   Specialty: Physician Assistant Why: at 10:15am Contact information: [place] [address] [phone]         Dereck Leep, MD Follow up on 04/03/2022.   Specialty: Orthopedic Surgery Why: at 2:30pm Contact information: [place] [address] [phone]                  Signed: Feliberto Gottron 02/17/2022, 9:38 AM

## 2022-02-17 NOTE — Plan of Care (Signed)
  Problem: Pain Management: Goal: Pain level will decrease with appropriate interventions Outcome: Progressing   Problem: Skin Integrity: Goal: Will show signs of wound healing Outcome: Progressing   Problem: Activity: Goal: Risk for activity intolerance will decrease Outcome: Progressing   Problem: Safety: Goal: Ability to remain free from injury will improve Outcome: Progressing   Problem: Skin Integrity: Goal: Risk for impaired skin integrity will decrease Outcome: Progressing

## 2022-02-17 NOTE — Evaluation (Signed)
Occupational Therapy Evaluation Patient Details Name: Anita Stone MRN: [id number] DOB: 1955/05/01 Today's Date: 02/17/2022   History of Present Illness Anita Stone  has presented today for surgery, with the diagnosis of PRIMARY OSTEOARTHRITIS OF LEFT KNEE. Pt is now S/P L TKR WBAT   Clinical Impression   Patient received for OT evaluation. See flowsheet below for details of function. Generally, patient requiring supervision with RW for functional mobility, and MIN A to set up for ADLs. All education provided to pt and husband; they are ready for d/c; PT already cleared. Patient with no further need for OT in acute care; discharge OT services.      Recommendations for follow up therapy are one component of a multi-disciplinary discharge planning process, led by the attending physician.  Recommendations may be updated based on patient status, additional functional criteria and insurance authorization.   Follow Up Recommendations  No OT follow up     Assistance Recommended at Discharge Intermittent Supervision/Assistance  Patient can return home with the following A little help with bathing/dressing/bathroom;Assistance with cooking/housework;Direct supervision/assist for medications management;Help with stairs or ramp for entrance;Assist for transportation    Functional Status Assessment  Patient has had a recent decline in their functional status and demonstrates the ability to make significant improvements in function in a reasonable and predictable amount of time.  Equipment Recommendations  None recommended by OT (pt has needed DME or can borrow from friends/family.)    Recommendations for Other Services       Precautions / Restrictions Precautions Precautions: Fall Restrictions Weight Bearing Restrictions: No LLE Weight Bearing: Weight bearing as tolerated      Mobility Bed Mobility               General bed mobility comments: Pt received walking in hallway  with PT; able to walk back to room with RW SBA and sit on EOB; at end of session pt walked around bed and t/f to chair with supervision using RW.    Transfers Overall transfer level: Modified independent Equipment used: Rolling walker (2 wheels)                      Balance                                           ADL either performed or assessed with clinical judgement   ADL Overall ADL's : Needs assistance/impaired                                       General ADL Comments: Anticipate pt to be set up for all ADLs; she is able to reach down (bending at hips) from seated position to touch L toe, so anticipate about to do LB Dressing with set up. Education provided on LB dressing techniques. Husband is also retired and able to help 24/7 as needed. Recommendation for husband to assist with all standing/moving IADL tasks.     Vision Baseline Vision/History: 1 Wears glasses       Perception     Praxis      Pertinent Vitals/Pain Pain Assessment Pain Assessment: 0-10 Pain Score: 7  Pain Location: L knee (TKA site) Pain Descriptors / Indicators: Aching Pain Intervention(s): Limited activity within patient's tolerance  Hand Dominance     Extremity/Trunk Assessment Upper Extremity Assessment Upper Extremity Assessment: Overall WFL for tasks assessed   Lower Extremity Assessment Lower Extremity Assessment: LLE deficits/detail;Defer to PT evaluation LLE Deficits / Details: s/p L TKA yesterday       Communication Communication Communication: No difficulties   Cognition Arousal/Alertness: Awake/alert Behavior During Therapy: WFL for tasks assessed/performed Overall Cognitive Status: Within Functional Limits for tasks assessed                                 General Comments: Pleasant and cooperative     General Comments  Pt with no further questions for OT at this time; confident about d/c home with husband  assist. No dizziness reported during session    Exercises     Shoulder Instructions      Home Living Family/patient expects to be discharged to:: Private residence Living Arrangements: Spouse/significant other Available Help at Discharge: Family;Friend(s) Type of Home: House Home Access: Stairs to enter [employer] of Steps: 3   Home Layout: One level     Bathroom Shower/Tub: Occupational psychologist: Handicapped height     [place]: Conservation officer, nature (2 wheels);Standard Walker;Shower seat (states her shower stool is a bit small, but she can borrow a better one from a friend/family)          Prior Functioning/Environment Prior Level of Function : Independent/Modified Independent             Mobility Comments: Limited mobility 2/2 knee pain. Pt states she has bursitis in her L hip and R shoulder OA. ADLs Comments: (I) PTA. 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        OT Problem List: Decreased activity tolerance      OT Treatment/Interventions:      OT Goals(Current goals can be found in the care plan section) Acute Rehab OT Goals Patient Stated Goal: Go home OT Goal Formulation: All assessment and education complete, DC therapy  OT Frequency:      Co-evaluation              AM-PAC OT "6 Clicks" Daily Activity     Outcome Measure Help from another person eating meals?: None Help from another person taking care of personal grooming?: None Help from another person toileting, which includes using toliet, bedpan, or urinal?: None Help from another person bathing (including washing, rinsing, drying)?: A Little Help from another person to put on and taking off regular upper body clothing?: None Help from another person to put on and taking off regular lower body clothing?: None 6 Click Score: 23   End of Session Equipment Utilized During Treatment: Rolling walker (2 wheels) Nurse  Communication: Mobility status  Activity Tolerance: Patient tolerated treatment well Patient left: in chair;with call bell/phone within reach (with BIL LE elevated; education on keeping knee in extension at all times while resting.)  OT Visit Diagnosis: Unsteadiness on feet (R26.81)                [id number] OT Time Calculation (min): 17 min Charges:  OT General Charges $OT Visit: 1 Visit OT Evaluation $OT Eval Low Complexity: 1 Low  Waymon Amato, MS, OTR/L   Vania Rea 02/17/2022, 10:09 AM

## 2022-02-17 NOTE — Progress Notes (Signed)
Discharge instructions reviewed with patient including followup visits and new medications.  Understanding was verbalized and all questions were answered.  IV removed without complication; patient tolerated well.  Patient discharged home via wheelchair in stable condition escorted by nursing staff.

## 2022-02-17 NOTE — Progress Notes (Signed)
   Subjective: 1 Day Post-Op Procedure(s) (LRB): COMPUTER ASSISTED TOTAL KNEE ARTHROPLAST (Left) Patient reports pain as mild.   Patient is well, and has had no acute complaints or problems Denies any CP, SOB, ABD pain. We will continue therapy today.  Plan is to go Home after hospital stay.  Objective: Vital signs in last 24 hours: Temp:  [96.8 F (36 C)-98.3 F (36.8 C)] 97.9 F (36.6 C) (02/17 0753) Pulse Rate:  [61-87] 61 (02/17 0753) Resp:  [10-23] 16 (02/17 0753) BP: (104-129)/(59-84) 129/72 (02/17 0753) SpO2:  [91 %-98 %] 96 % (02/17 0753) Weight:  [76.1 kg] 76.1 kg (02/16 0953)  Intake/Output from previous day: 02/16 0701 - 02/17 0700 In: 2060 [P.O.:360; I.V.:1300; IV Piggyback:400] Out: 881 [Urine:601; Drains:230; Blood:50] Intake/Output this shift: Total I/O In: 1284.6 [I.V.:1084.6; IV Piggyback:200] Out: -   No results for input(s): "HGB" in the last 72 hours. No results for input(s): "WBC", "RBC", "HCT", "PLT" in the last 72 hours. No results for input(s): "NA", "K", "CL", "CO2", "BUN", "CREATININE", "GLUCOSE", "CALCIUM" in the last 72 hours. No results for input(s): "LABPT", "INR" in the last 72 hours.  EXAM General - Patient is Alert, Appropriate, and Oriented Extremity - Neurovascular intact Sensation intact distally Intact pulses distally Dorsiflexion/Plantar flexion intact No cellulitis present Compartment soft Dressing - dressing C/D/I and no drainage, Hemovac intact, removed Motor Function - intact, moving foot and toes well on exam.   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   Weight as of this encounter: 76.1 kg. Advance diet Up with therapy Patient doing well.  Pain well-controlled. Vital signs are stable Patient successfully completed all PT goals this morning.  Patient stable and ready for discharge to home with home health PT today.  DVT Prophylaxis - Lovenox, TED hose, and SCDs Weight-Bearing as tolerated to left leg   T. ChRachelle HoraPA-C [place]/17/2024, 9:40 AM

## 2022-02-17 NOTE — Plan of Care (Signed)
  Problem: Education: Goal: Knowledge of the prescribed therapeutic regimen will improve Outcome: Progressing   Problem: Activity: Goal: Ability to avoid complications of mobility impairment will improve Outcome: Progressing   Problem: Clinical Measurements: Goal: Postoperative complications will be avoided or minimized Outcome: Progressing   Problem: Pain Management: Goal: Pain level will decrease with appropriate interventions Outcome: Progressing   Problem: Activity: Goal: Risk for activity intolerance will decrease Outcome: Progressing   Problem: Nutrition: Goal: Adequate nutrition will be maintained Outcome: Progressing

## 2022-02-17 NOTE — Evaluation (Signed)
Physical Therapy Evaluation Patient Details Name: Anita Stone MRN: [id number] DOB: 12/11/1955 Today's Date: 02/17/2022  History of Present Illness  Anita Stone  has presented today for surgery, with the diagnosis of PRIMARY OSTEOARTHRITIS OF LEFT KNEE. Pt is now S/P L TKR WBAT  Clinical Impression  Pt received in bed with spouse by her side agreeable to PT eval. Pt pleasant and motivated who reported of 6/10 pain at rest and with movement 7/10. Pt received Pain meds by nurse during the session via IV. Pt PLOF Ind with all functional mobility without AD with specific to avoiding uneven surface. Today Pt's BP WNL in all three position. Pt  demonstrated 7 to 78 degrees L knee ROM in ace-wrap. Pt tends to guard the knee. Total jt AROM ex reviewed and provided in H/O and advised pt to do them every 2 to 3 hours and to use Ice to manage pain and edema.  Pt performed Bed mobility Ind and Transfers with  min guard to Sup using FWW WBAT to LLE. Ambulated in room and hallways with [place] with min guard and practiced 3 steps side way up and down with Min guard of 1. Pt demonstrated decreased heel strike, knee flexion, toe off and loading response to LLE. PT provided VC to improve heel strike and loading response. Spouse present and education completed. Pt tol tx well. Ptill continue in acute care and pt will benefit from HHPT after acute care fro safe D/C and recovery.      Recommendations for follow up therapy are one component of a multi-disciplinary discharge planning process, led by the attending physician.  Recommendations may be updated based on patient status, additional functional criteria and insurance authorization.  Follow Up Recommendations Home health PT      Assistance Recommended at Discharge PRN  Patient can return home with the following  A little help with walking and/or transfers;A little help with bathing/dressing/bathroom;Assistance with cooking/housework;Assist for  transportation;Help with stairs or ramp for entrance    Equipment Recommendations None recommended by PT (Pt has all equipment.)  Recommendations for Other Services       Functional Status Assessment Patient has had a recent decline in their functional status and demonstrates the ability to make significant improvements in function in a reasonable and predictable amount of time.     Precautions / Restrictions Precautions Precautions: Fall Restrictions Weight Bearing Restrictions: No LLE Weight Bearing: Weight bearing as tolerated      Mobility  Bed Mobility Overal bed mobility: Independent                  Transfers Overall transfer level: Needs assistance Equipment used: Rolling walker (2 wheels) Transfers: Sit to/from Stand, Bed to chair/wheelchair/BSC Sit to Stand: Supervision   Step pivot transfers: Min guard       General transfer comment: pt slow, guarded but safe    Ambulation/Gait Ambulation/Gait assistance: Min guard, Supervision Gait Distance (Feet): 200 Feet Assistive device: Rolling walker (2 wheels) Gait Pattern/deviations: Step-to pattern, Step-through pattern, Antalgic, Decreased stride length Gait velocity: dec     General Gait Details: Pt amb with decreased heel strike, toe off and loading resposne to LLE.  Stairs Stairs: Yes Stairs assistance: Min guard Stair Management: One rail Left Number of Stairs: 3 General stair comments: side ascending and descending.  Wheelchair Mobility    Modified Rankin (Stroke Patients Only)       Balance Overall balance assessment: Needs assistance Sitting-balance support: Bilateral upper extremity supported Sitting  balance-Leahy Scale: Normal     Standing balance support: Reliant on assistive device for balance, Bilateral upper extremity supported Standing balance-Leahy Scale: Good Standing balance comment: steady on feet.                             Pertinent Vitals/Pain Pain  Assessment Pain Assessment: No/denies pain Pain Score: 6  Pain Location: L knee Pain Descriptors / Indicators: Constant, Aching Pain Intervention(s): Limited activity within patient's tolerance    Home Living Family/patient expects to be discharged to:: Private residence Living Arrangements: Spouse/significant other Available Help at Discharge: Family;Friend(s) Type of Home: House Home Access: Stairs to enter Entrance Stairs-Rails: Left Entrance Stairs-Number of Steps: 3   Home Layout: One level Home Equipment: Conservation officer, nature (2 wheels);Standard Walker;Shower seat (states her shower stool is a bit small, but she can borrow a better one from a friend/family)      Prior Function Prior Level of Function : Independent/Modified Independent             Mobility Comments: Limited mobility 2/2 knee pain. Pt states she has bursitis in her L hip and R shoulder OA. ADLs Comments: (I) PTA. 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     Hand Dominance        Extremity/Trunk Assessment   Upper Extremity Assessment Upper Extremity Assessment: Overall WFL for tasks assessed    Lower Extremity Assessment Lower Extremity Assessment: LLE deficits/detail;Defer to PT evaluation LLE Deficits / Details: s/p L TKA yesterday LLE Sensation: WNL LLE Coordination: WNL       Communication   Communication: No difficulties  Cognition Arousal/Alertness: Awake/alert Behavior During Therapy: WFL for tasks assessed/performed Overall Cognitive Status: Within Functional Limits for tasks assessed                                 General Comments: Cooperative, motivated and pleasant        General Comments General comments (skin integrity, edema, etc.): Pt with no further questions for OT at this time; confident about d/c home with husband assist. No dizziness reported during session    Exercises Total Joint Exercises Ankle Circles/Pumps:  AROM, Left, 10 reps, Supine Quad Sets: AROM, Left, 10 reps, Supine Short Arc Quad: AROM, Left, 10 reps, Supine Heel Slides: AROM, Left, 10 reps, Supine Hip ABduction/ADduction: AROM, Left, 10 reps, Supine Straight Leg Raises: AROM, Left, 10 reps, Supine Long Arc Quad: AROM, 10 reps, Seated, Left Knee Flexion: AROM, Left, 10 reps, Seated Goniometric ROM: L knee: 7 to 78 degrees in acewrap. Other Exercises Other Exercises: H/O provided and advised to eprform then every 2 to 3 hours 10 reps each.   Assessment/Plan    PT Assessment Patient needs continued PT services  PT Problem List Decreased strength;Decreased range of motion;Decreased safety awareness;Decreased balance;Pain       PT Treatment Interventions Gait training;Stair training;Functional mobility training;Therapeutic activities;Therapeutic exercise;Balance training;Neuromuscular re-education;Patient/family education    PT Goals (Current goals can be found in the Care Plan section)  Acute Rehab PT Goals Patient Stated Goal: " Go home and fet stronger again." PT Goal Formulation: With patient Time For Goal Achievement: 03/03/22 Potential to Achieve Goals: Good    Frequency BID     Co-evaluation               AM-PAC PT "6 Clicks" Mobility  Outcome Measure Help needed turning from your back to your side while in a flat bed without using bedrails?: None Help needed moving from lying on your back to sitting on the side of a flat bed without using bedrails?: None Help needed moving to and from a bed to a chair (including a wheelchair)?: A Little Help needed standing up from a chair using your arms (e.g., wheelchair or bedside chair)?: A Little Help needed to walk in hospital room?: A Little Help needed climbing 3-5 steps with a railing? : A Little 6 Click Score: 20    End of Session Equipment Utilized During Treatment: Gait belt       PT Visit Diagnosis: Muscle weakness (generalized) (M62.81);Pain;Difficulty in  walking, not elsewhere classified (R26.2) Pain - Right/Left: Left Pain - part of body: Knee    Time: [id number] PT Time Calculation (min) (ACUTE ONLY): 51 min   Charges:   PT Evaluation $PT Eval Low Complexity: 1 Low PT Treatments $Gait Training: 8-22 mins $Therapeutic Activity: 8-22 mins      Ashey Tramontana PT DPT 11:06 AM,02/17/22

## 2022-02-17 NOTE — Anesthesia Postprocedure Evaluation (Signed)
Anesthesia Post Note  Patient: KWANNA FREEHILL  Procedure(s) Performed: COMPUTER ASSISTED TOTAL KNEE ARTHROPLAST (Left: Knee)  Patient location during evaluation: Nursing Unit Anesthesia Type: Spinal Level of consciousness: oriented and awake and alert Pain management: pain level controlled Vital Signs Assessment: post-procedure vital signs reviewed and stable Respiratory status: spontaneous breathing, respiratory function stable and patient connected to nasal cannula oxygen Cardiovascular status: blood pressure returned to baseline and stable Postop Assessment: no headache, no backache and no apparent nausea or vomiting Anesthetic complications: no   No notable events documented.   Last Vitals:  Vitals:   02/16/22 2343 02/17/22 0408  BP: 116/62 105/61  Pulse: 65 66  Resp: 18 18  Temp: 36.7 C 36.7 C  SpO2: 97% 95%    Last Pain:  Vitals:   02/17/22 0532  TempSrc:   PainSc: 5                  Arita Miss

## 2022-02-18 DIAGNOSIS — Z9181 History of falling: Secondary | ICD-10-CM | POA: Diagnosis not present

## 2022-02-18 DIAGNOSIS — Z85828 Personal history of other malignant neoplasm of skin: Secondary | ICD-10-CM | POA: Diagnosis not present

## 2022-02-18 DIAGNOSIS — Z96652 Presence of left artificial knee joint: Secondary | ICD-10-CM | POA: Diagnosis not present

## 2022-02-18 DIAGNOSIS — Z85820 Personal history of malignant melanoma of skin: Secondary | ICD-10-CM | POA: Diagnosis not present

## 2022-02-18 DIAGNOSIS — M1990 Unspecified osteoarthritis, unspecified site: Secondary | ICD-10-CM | POA: Diagnosis not present

## 2022-02-18 DIAGNOSIS — Z791 Long term (current) use of non-steroidal anti-inflammatories (NSAID): Secondary | ICD-10-CM | POA: Diagnosis not present

## 2022-02-18 DIAGNOSIS — Z86010 Personal history of colonic polyps: Secondary | ICD-10-CM | POA: Diagnosis not present

## 2022-02-18 DIAGNOSIS — Z471 Aftercare following joint replacement surgery: Secondary | ICD-10-CM | POA: Diagnosis not present

## 2022-02-18 DIAGNOSIS — Z7901 Long term (current) use of anticoagulants: Secondary | ICD-10-CM | POA: Diagnosis not present

## 2022-02-18 DIAGNOSIS — M519 Unspecified thoracic, thoracolumbar and lumbosacral intervertebral disc disorder: Secondary | ICD-10-CM | POA: Diagnosis not present

## 2022-02-18 DIAGNOSIS — G43909 Migraine, unspecified, not intractable, without status migrainosus: Secondary | ICD-10-CM | POA: Diagnosis not present

## 2022-02-18 DIAGNOSIS — M419 Scoliosis, unspecified: Secondary | ICD-10-CM | POA: Diagnosis not present

## 2022-02-18 DIAGNOSIS — K5900 Constipation, unspecified: Secondary | ICD-10-CM | POA: Diagnosis not present

## 2022-02-18 DIAGNOSIS — Z86008 Personal history of in-situ neoplasm of other site: Secondary | ICD-10-CM | POA: Diagnosis not present

## 2022-02-19 ENCOUNTER — Encounter: Payer: Self-pay | Admitting: Orthopedic Surgery

## 2022-02-27 DIAGNOSIS — R350 Frequency of micturition: Secondary | ICD-10-CM | POA: Diagnosis not present

## 2022-03-02 DIAGNOSIS — G43909 Migraine, unspecified, not intractable, without status migrainosus: Secondary | ICD-10-CM | POA: Diagnosis not present

## 2022-03-02 DIAGNOSIS — M519 Unspecified thoracic, thoracolumbar and lumbosacral intervertebral disc disorder: Secondary | ICD-10-CM | POA: Diagnosis not present

## 2022-03-02 DIAGNOSIS — Z86008 Personal history of in-situ neoplasm of other site: Secondary | ICD-10-CM | POA: Diagnosis not present

## 2022-03-02 DIAGNOSIS — Z471 Aftercare following joint replacement surgery: Secondary | ICD-10-CM | POA: Diagnosis not present

## 2022-03-02 DIAGNOSIS — Z7901 Long term (current) use of anticoagulants: Secondary | ICD-10-CM | POA: Diagnosis not present

## 2022-03-02 DIAGNOSIS — Z791 Long term (current) use of non-steroidal anti-inflammatories (NSAID): Secondary | ICD-10-CM | POA: Diagnosis not present

## 2022-03-02 DIAGNOSIS — Z9181 History of falling: Secondary | ICD-10-CM | POA: Diagnosis not present

## 2022-03-02 DIAGNOSIS — Z86010 Personal history of colonic polyps: Secondary | ICD-10-CM | POA: Diagnosis not present

## 2022-03-02 DIAGNOSIS — K5900 Constipation, unspecified: Secondary | ICD-10-CM | POA: Diagnosis not present

## 2022-03-02 DIAGNOSIS — Z96652 Presence of left artificial knee joint: Secondary | ICD-10-CM | POA: Diagnosis not present

## 2022-03-02 DIAGNOSIS — Z85828 Personal history of other malignant neoplasm of skin: Secondary | ICD-10-CM | POA: Diagnosis not present

## 2022-03-02 DIAGNOSIS — Z85820 Personal history of malignant melanoma of skin: Secondary | ICD-10-CM | POA: Diagnosis not present

## 2022-03-02 DIAGNOSIS — M1990 Unspecified osteoarthritis, unspecified site: Secondary | ICD-10-CM | POA: Diagnosis not present

## 2022-03-02 DIAGNOSIS — M419 Scoliosis, unspecified: Secondary | ICD-10-CM | POA: Diagnosis not present

## 2022-03-05 DIAGNOSIS — M25562 Pain in left knee: Secondary | ICD-10-CM | POA: Diagnosis not present

## 2022-03-05 DIAGNOSIS — M25462 Effusion, left knee: Secondary | ICD-10-CM | POA: Diagnosis not present

## 2022-03-08 DIAGNOSIS — M25462 Effusion, left knee: Secondary | ICD-10-CM | POA: Diagnosis not present

## 2022-03-08 DIAGNOSIS — M25562 Pain in left knee: Secondary | ICD-10-CM | POA: Diagnosis not present

## 2022-03-13 DIAGNOSIS — M25562 Pain in left knee: Secondary | ICD-10-CM | POA: Diagnosis not present

## 2022-03-13 DIAGNOSIS — M25462 Effusion, left knee: Secondary | ICD-10-CM | POA: Diagnosis not present

## 2022-03-15 DIAGNOSIS — M25462 Effusion, left knee: Secondary | ICD-10-CM | POA: Diagnosis not present

## 2022-03-15 DIAGNOSIS — M25562 Pain in left knee: Secondary | ICD-10-CM | POA: Diagnosis not present

## 2022-03-20 DIAGNOSIS — M25462 Effusion, left knee: Secondary | ICD-10-CM | POA: Diagnosis not present

## 2022-03-20 DIAGNOSIS — M25562 Pain in left knee: Secondary | ICD-10-CM | POA: Diagnosis not present

## 2022-03-22 DIAGNOSIS — M25562 Pain in left knee: Secondary | ICD-10-CM | POA: Diagnosis not present

## 2022-03-22 DIAGNOSIS — M25462 Effusion, left knee: Secondary | ICD-10-CM | POA: Diagnosis not present

## 2022-03-26 DIAGNOSIS — M25562 Pain in left knee: Secondary | ICD-10-CM | POA: Diagnosis not present

## 2022-03-26 DIAGNOSIS — M25462 Effusion, left knee: Secondary | ICD-10-CM | POA: Diagnosis not present

## 2022-03-29 ENCOUNTER — Ambulatory Visit
Admission: RE | Admit: 2022-03-29 | Discharge: 2022-03-29 | Disposition: A | Discharge status: Home / Self Care | Payer: PPO | Source: Ambulatory Visit | Attending: Family Medicine | Admitting: Family Medicine

## 2022-03-29 DIAGNOSIS — Z1231 Encounter for screening mammogram for malignant neoplasm of breast: Secondary | ICD-10-CM | POA: Insufficient documentation

## 2022-03-29 DIAGNOSIS — M25562 Pain in left knee: Secondary | ICD-10-CM | POA: Diagnosis not present

## 2022-03-29 DIAGNOSIS — M25462 Effusion, left knee: Secondary | ICD-10-CM | POA: Diagnosis not present

## 2022-04-03 ENCOUNTER — Other Ambulatory Visit: Payer: Self-pay | Admitting: Family Medicine

## 2022-04-03 DIAGNOSIS — Z96652 Presence of left artificial knee joint: Secondary | ICD-10-CM | POA: Diagnosis not present

## 2022-04-03 DIAGNOSIS — R928 Other abnormal and inconclusive findings on diagnostic imaging of breast: Secondary | ICD-10-CM

## 2022-04-03 DIAGNOSIS — N63 Unspecified lump in unspecified breast: Secondary | ICD-10-CM

## 2022-04-04 DIAGNOSIS — M25562 Pain in left knee: Secondary | ICD-10-CM | POA: Diagnosis not present

## 2022-04-04 DIAGNOSIS — M25462 Effusion, left knee: Secondary | ICD-10-CM | POA: Diagnosis not present

## 2022-04-05 ENCOUNTER — Ambulatory Visit
Admission: RE | Admit: 2022-04-05 | Discharge: 2022-04-05 | Disposition: A | Discharge status: Home / Self Care | Payer: PPO | Source: Ambulatory Visit | Attending: Family Medicine | Admitting: Family Medicine

## 2022-04-05 DIAGNOSIS — N63 Unspecified lump in unspecified breast: Secondary | ICD-10-CM

## 2022-04-05 DIAGNOSIS — R928 Other abnormal and inconclusive findings on diagnostic imaging of breast: Secondary | ICD-10-CM

## 2022-04-05 DIAGNOSIS — N6012 Diffuse cystic mastopathy of left breast: Secondary | ICD-10-CM | POA: Diagnosis not present

## 2022-04-25 DIAGNOSIS — M7062 Trochanteric bursitis, left hip: Secondary | ICD-10-CM | POA: Diagnosis not present

## 2022-04-26 DIAGNOSIS — D2271 Melanocytic nevi of right lower limb, including hip: Secondary | ICD-10-CM | POA: Diagnosis not present

## 2022-04-26 DIAGNOSIS — D2262 Melanocytic nevi of left upper limb, including shoulder: Secondary | ICD-10-CM | POA: Diagnosis not present

## 2022-04-26 DIAGNOSIS — D2272 Melanocytic nevi of left lower limb, including hip: Secondary | ICD-10-CM | POA: Diagnosis not present

## 2022-04-26 DIAGNOSIS — D485 Neoplasm of uncertain behavior of skin: Secondary | ICD-10-CM | POA: Diagnosis not present

## 2022-04-26 DIAGNOSIS — L821 Other seborrheic keratosis: Secondary | ICD-10-CM | POA: Diagnosis not present

## 2022-04-26 DIAGNOSIS — L570 Actinic keratosis: Secondary | ICD-10-CM | POA: Diagnosis not present

## 2022-04-26 DIAGNOSIS — D225 Melanocytic nevi of trunk: Secondary | ICD-10-CM | POA: Diagnosis not present

## 2022-04-26 DIAGNOSIS — D2261 Melanocytic nevi of right upper limb, including shoulder: Secondary | ICD-10-CM | POA: Diagnosis not present

## 2022-05-04 DIAGNOSIS — H524 Presbyopia: Secondary | ICD-10-CM | POA: Diagnosis not present

## 2022-06-14 DIAGNOSIS — C439 Malignant melanoma of skin, unspecified: Secondary | ICD-10-CM | POA: Diagnosis not present

## 2022-06-14 DIAGNOSIS — R0789 Other chest pain: Secondary | ICD-10-CM | POA: Diagnosis not present

## 2022-06-14 DIAGNOSIS — R5383 Other fatigue: Secondary | ICD-10-CM | POA: Diagnosis not present

## 2022-06-14 DIAGNOSIS — M25511 Pain in right shoulder: Secondary | ICD-10-CM | POA: Diagnosis not present

## 2022-06-14 DIAGNOSIS — R5381 Other malaise: Secondary | ICD-10-CM | POA: Diagnosis not present

## 2022-08-10 DIAGNOSIS — Z0000 Encounter for general adult medical examination without abnormal findings: Secondary | ICD-10-CM | POA: Diagnosis not present

## 2022-08-10 DIAGNOSIS — K219 Gastro-esophageal reflux disease without esophagitis: Secondary | ICD-10-CM | POA: Diagnosis not present

## 2022-08-20 DIAGNOSIS — R413 Other amnesia: Secondary | ICD-10-CM | POA: Diagnosis not present

## 2022-08-20 DIAGNOSIS — Z0000 Encounter for general adult medical examination without abnormal findings: Secondary | ICD-10-CM | POA: Diagnosis not present

## 2022-08-20 DIAGNOSIS — K219 Gastro-esophageal reflux disease without esophagitis: Secondary | ICD-10-CM | POA: Diagnosis not present

## 2022-08-20 DIAGNOSIS — Z1211 Encounter for screening for malignant neoplasm of colon: Secondary | ICD-10-CM | POA: Diagnosis not present

## 2022-08-20 DIAGNOSIS — M25511 Pain in right shoulder: Secondary | ICD-10-CM | POA: Diagnosis not present

## 2022-08-22 DIAGNOSIS — R413 Other amnesia: Secondary | ICD-10-CM | POA: Diagnosis not present

## 2022-08-22 DIAGNOSIS — M25511 Pain in right shoulder: Secondary | ICD-10-CM | POA: Diagnosis not present

## 2022-08-28 DIAGNOSIS — R7989 Other specified abnormal findings of blood chemistry: Secondary | ICD-10-CM | POA: Diagnosis not present

## 2022-10-30 DIAGNOSIS — G3184 Mild cognitive impairment, so stated: Secondary | ICD-10-CM | POA: Diagnosis not present

## 2022-10-30 DIAGNOSIS — R0683 Snoring: Secondary | ICD-10-CM | POA: Diagnosis not present

## 2022-11-01 ENCOUNTER — Other Ambulatory Visit: Payer: Self-pay | Admitting: Neurology

## 2022-11-01 DIAGNOSIS — G3184 Mild cognitive impairment, so stated: Secondary | ICD-10-CM

## 2022-11-12 ENCOUNTER — Ambulatory Visit
Admission: RE | Admit: 2022-11-12 | Discharge: 2022-11-12 | Disposition: A | Discharge status: Home / Self Care | Payer: PPO | Source: Ambulatory Visit | Attending: Neurology | Admitting: Neurology

## 2022-11-12 DIAGNOSIS — G3184 Mild cognitive impairment, so stated: Secondary | ICD-10-CM | POA: Diagnosis not present

## 2022-11-12 DIAGNOSIS — I6782 Cerebral ischemia: Secondary | ICD-10-CM | POA: Diagnosis not present

## 2022-11-27 DIAGNOSIS — M75101 Unspecified rotator cuff tear or rupture of right shoulder, not specified as traumatic: Secondary | ICD-10-CM | POA: Diagnosis not present

## 2022-11-27 DIAGNOSIS — G8929 Other chronic pain: Secondary | ICD-10-CM | POA: Diagnosis not present

## 2022-11-27 DIAGNOSIS — D1721 Benign lipomatous neoplasm of skin and subcutaneous tissue of right arm: Secondary | ICD-10-CM | POA: Diagnosis not present

## 2022-11-27 DIAGNOSIS — M7521 Bicipital tendinitis, right shoulder: Secondary | ICD-10-CM | POA: Diagnosis not present

## 2022-11-27 DIAGNOSIS — M7541 Impingement syndrome of right shoulder: Secondary | ICD-10-CM | POA: Diagnosis not present

## 2022-11-27 DIAGNOSIS — M19011 Primary osteoarthritis, right shoulder: Secondary | ICD-10-CM | POA: Diagnosis not present

## 2022-12-20 DIAGNOSIS — G8929 Other chronic pain: Secondary | ICD-10-CM | POA: Diagnosis not present

## 2022-12-20 DIAGNOSIS — M25511 Pain in right shoulder: Secondary | ICD-10-CM | POA: Diagnosis not present

## 2023-01-08 DIAGNOSIS — Q438 Other specified congenital malformations of intestine: Secondary | ICD-10-CM | POA: Diagnosis not present

## 2023-01-08 DIAGNOSIS — K219 Gastro-esophageal reflux disease without esophagitis: Secondary | ICD-10-CM | POA: Diagnosis not present

## 2023-01-08 DIAGNOSIS — Z860101 Personal history of adenomatous and serrated colon polyps: Secondary | ICD-10-CM | POA: Diagnosis not present

## 2023-02-11 ENCOUNTER — Ambulatory Visit: Payer: PPO

## 2023-02-11 DIAGNOSIS — Z09 Encounter for follow-up examination after completed treatment for conditions other than malignant neoplasm: Secondary | ICD-10-CM | POA: Diagnosis not present

## 2023-02-11 DIAGNOSIS — K5730 Diverticulosis of large intestine without perforation or abscess without bleeding: Secondary | ICD-10-CM | POA: Diagnosis not present

## 2023-02-11 DIAGNOSIS — Z860101 Personal history of adenomatous and serrated colon polyps: Secondary | ICD-10-CM | POA: Diagnosis not present

## 2023-02-11 DIAGNOSIS — K449 Diaphragmatic hernia without obstruction or gangrene: Secondary | ICD-10-CM | POA: Diagnosis not present

## 2023-02-11 DIAGNOSIS — K2100 Gastro-esophageal reflux disease with esophagitis, without bleeding: Secondary | ICD-10-CM | POA: Diagnosis not present

## 2023-02-19 DIAGNOSIS — Z96652 Presence of left artificial knee joint: Secondary | ICD-10-CM | POA: Diagnosis not present

## 2023-03-04 ENCOUNTER — Other Ambulatory Visit: Payer: Self-pay | Admitting: Family Medicine

## 2023-03-04 DIAGNOSIS — Z1231 Encounter for screening mammogram for malignant neoplasm of breast: Secondary | ICD-10-CM

## 2023-04-01 DIAGNOSIS — R0683 Snoring: Secondary | ICD-10-CM | POA: Diagnosis not present

## 2023-04-01 DIAGNOSIS — G479 Sleep disorder, unspecified: Secondary | ICD-10-CM | POA: Diagnosis not present

## 2023-04-01 DIAGNOSIS — G3184 Mild cognitive impairment, so stated: Secondary | ICD-10-CM | POA: Diagnosis not present

## 2023-04-01 DIAGNOSIS — G939 Disorder of brain, unspecified: Secondary | ICD-10-CM | POA: Diagnosis not present

## 2023-04-01 DIAGNOSIS — I10 Essential (primary) hypertension: Secondary | ICD-10-CM | POA: Diagnosis not present

## 2023-04-02 ENCOUNTER — Ambulatory Visit
Admission: RE | Admit: 2023-04-02 | Discharge: 2023-04-02 | Disposition: A | Discharge status: Home / Self Care | Source: Ambulatory Visit | Attending: Family Medicine | Admitting: Family Medicine

## 2023-04-02 DIAGNOSIS — Z1231 Encounter for screening mammogram for malignant neoplasm of breast: Secondary | ICD-10-CM | POA: Diagnosis not present

## 2023-05-02 DIAGNOSIS — D2261 Melanocytic nevi of right upper limb, including shoulder: Secondary | ICD-10-CM | POA: Diagnosis not present

## 2023-05-02 DIAGNOSIS — D2272 Melanocytic nevi of left lower limb, including hip: Secondary | ICD-10-CM | POA: Diagnosis not present

## 2023-05-02 DIAGNOSIS — Z85828 Personal history of other malignant neoplasm of skin: Secondary | ICD-10-CM | POA: Diagnosis not present

## 2023-05-02 DIAGNOSIS — D0461 Carcinoma in situ of skin of right upper limb, including shoulder: Secondary | ICD-10-CM | POA: Diagnosis not present

## 2023-05-02 DIAGNOSIS — L570 Actinic keratosis: Secondary | ICD-10-CM | POA: Diagnosis not present

## 2023-05-02 DIAGNOSIS — L905 Scar conditions and fibrosis of skin: Secondary | ICD-10-CM | POA: Diagnosis not present

## 2023-05-02 DIAGNOSIS — Z85820 Personal history of malignant melanoma of skin: Secondary | ICD-10-CM | POA: Diagnosis not present

## 2023-05-02 DIAGNOSIS — D2372 Other benign neoplasm of skin of left lower limb, including hip: Secondary | ICD-10-CM | POA: Diagnosis not present

## 2023-05-02 DIAGNOSIS — D485 Neoplasm of uncertain behavior of skin: Secondary | ICD-10-CM | POA: Diagnosis not present

## 2023-05-02 DIAGNOSIS — D2370 Other benign neoplasm of skin of unspecified lower limb, including hip: Secondary | ICD-10-CM | POA: Diagnosis not present

## 2023-05-02 DIAGNOSIS — D2262 Melanocytic nevi of left upper limb, including shoulder: Secondary | ICD-10-CM | POA: Diagnosis not present

## 2023-05-02 DIAGNOSIS — L590 Erythema ab igne [dermatitis ab igne]: Secondary | ICD-10-CM | POA: Diagnosis not present

## 2023-05-02 DIAGNOSIS — D225 Melanocytic nevi of trunk: Secondary | ICD-10-CM | POA: Diagnosis not present

## 2023-08-15 DIAGNOSIS — Z0000 Encounter for general adult medical examination without abnormal findings: Secondary | ICD-10-CM | POA: Diagnosis not present

## 2023-08-22 DIAGNOSIS — M25511 Pain in right shoulder: Secondary | ICD-10-CM | POA: Diagnosis not present

## 2023-08-22 DIAGNOSIS — G3184 Mild cognitive impairment, so stated: Secondary | ICD-10-CM | POA: Diagnosis not present

## 2023-08-22 DIAGNOSIS — Z1331 Encounter for screening for depression: Secondary | ICD-10-CM | POA: Diagnosis not present

## 2023-08-22 DIAGNOSIS — C439 Malignant melanoma of skin, unspecified: Secondary | ICD-10-CM | POA: Diagnosis not present

## 2023-08-22 DIAGNOSIS — Z0000 Encounter for general adult medical examination without abnormal findings: Secondary | ICD-10-CM | POA: Diagnosis not present

## 2023-08-22 DIAGNOSIS — M7061 Trochanteric bursitis, right hip: Secondary | ICD-10-CM | POA: Diagnosis not present

## 2023-08-22 DIAGNOSIS — K2100 Gastro-esophageal reflux disease with esophagitis, without bleeding: Secondary | ICD-10-CM | POA: Diagnosis not present

## 2023-10-04 DIAGNOSIS — D2272 Melanocytic nevi of left lower limb, including hip: Secondary | ICD-10-CM | POA: Diagnosis not present

## 2023-10-04 DIAGNOSIS — D485 Neoplasm of uncertain behavior of skin: Secondary | ICD-10-CM | POA: Diagnosis not present

## 2023-10-04 DIAGNOSIS — D2372 Other benign neoplasm of skin of left lower limb, including hip: Secondary | ICD-10-CM | POA: Diagnosis not present

## 2023-10-04 DIAGNOSIS — D0461 Carcinoma in situ of skin of right upper limb, including shoulder: Secondary | ICD-10-CM | POA: Diagnosis not present

## 2023-11-21 DIAGNOSIS — N644 Mastodynia: Secondary | ICD-10-CM | POA: Diagnosis not present

## 2023-11-21 DIAGNOSIS — Z9181 History of falling: Secondary | ICD-10-CM | POA: Diagnosis not present

## 2023-12-02 DIAGNOSIS — R0789 Other chest pain: Secondary | ICD-10-CM | POA: Diagnosis not present

## 2023-12-02 DIAGNOSIS — Z9181 History of falling: Secondary | ICD-10-CM | POA: Diagnosis not present
